# Patient Record
Sex: Female | Born: 1990 | Race: White | Marital: Married
Health system: Southern US, Community
[De-identification: ages and names within clinical notes are randomized; demographics above are authoritative.]

## PROBLEM LIST (undated history)

## (undated) DIAGNOSIS — K529 Noninfective gastroenteritis and colitis, unspecified: Secondary | ICD-10-CM

## (undated) DIAGNOSIS — R05 Cough: Secondary | ICD-10-CM

## (undated) DIAGNOSIS — R059 Cough, unspecified: Secondary | ICD-10-CM

## (undated) DIAGNOSIS — K219 Gastro-esophageal reflux disease without esophagitis: Secondary | ICD-10-CM

## (undated) DIAGNOSIS — J189 Pneumonia, unspecified organism: Secondary | ICD-10-CM

## (undated) HISTORY — DX: Gastro-esophageal reflux disease without esophagitis: K21.9

## (undated) HISTORY — DX: Pneumonia, unspecified organism: J18.9

## (undated) HISTORY — PX: OTHER SURGICAL HISTORY: SHX169

## (undated) HISTORY — DX: Cough: R05

## (undated) HISTORY — DX: Cough, unspecified: R05.9

---

## 2011-07-20 ENCOUNTER — Encounter: Payer: Self-pay | Admitting: Internal Medicine

## 2011-07-21 ENCOUNTER — Institutional Professional Consult (permissible substitution): Payer: Self-pay | Admitting: Internal Medicine

## 2011-07-31 ENCOUNTER — Encounter: Payer: Self-pay | Admitting: Internal Medicine

## 2011-07-31 ENCOUNTER — Ambulatory Visit (INDEPENDENT_AMBULATORY_CARE_PROVIDER_SITE_OTHER): Payer: Self-pay | Admitting: Internal Medicine

## 2011-07-31 ENCOUNTER — Institutional Professional Consult (permissible substitution): Payer: Self-pay | Admitting: Internal Medicine

## 2011-07-31 DIAGNOSIS — J479 Bronchiectasis, uncomplicated: Secondary | ICD-10-CM | POA: Insufficient documentation

## 2011-07-31 MED ORDER — AMOXICILLIN-POT CLAVULANATE 875-125 MG PO TABS: 1.0000 | ORAL_TABLET | Freq: Two times a day (BID) | ORAL | Status: DC

## 2011-07-31 NOTE — Patient Instructions (Addendum)
Bronchiectasis =   you have scarring of your bronchial tubes which means that they don't function perfectly normally and mucus tends to pool in certain areas of your lung which can cause pneumonia and further scarring of your lung and bronchial tubes  Whenever you develop cough congestion take mucinex or mucinex dm > these will help keep the mucus loose and flowing but if your condition worsens you need to seek help immediately preferably here or somewhere inside the [facility] system to compare xrays ( worse = darker or bloody mucus or pain on breathing in)    Please remember to go to the lab department downstairs for your tests - we will call you with the results when they are available.  Augmentin 875 one twice daily bfast and supper with a large glass of water and eat culture yogurt for lunch  Best cough medication is mucinex dm 1-2 every 12 hours as needed  Please schedule a follow up office visit in 2 weeks, sooner if needed - we'll go over your studies in detail and decide about ct scans then

## 2011-07-31 NOTE — Assessment & Plan Note (Addendum)
-   Quantitative Immunoglobulin profile 07/31/2011 >    - Alpha one AT 07/31/2011 >>    - Allergy profile 07/31/2011 >>>  Persistent basliar densities assoc with clubbing c/w bronchiectasis ? Sinusitis suggesting some form of MC dysfunction - newly acquired so doubt CF but could have some form of ciliary dyskinesia or alpha one def and immunodeficiency  Will need to return for pft's and ct sinus and chest and be referred to [facility] for sweat chloride to complete the w/u  See instructions for specific recommendations which were reviewed directly with the patient who was given a copy with highlighter outlining the key components.

## 2011-07-31 NOTE — Progress Notes (Signed)
  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 with persistent cough x 2011 referred by Dr Reola Calkins 07/31/2011 for pulmonary eval for abn cxr   07/31/2011  1st pulmonary eval cc never completely better since onset of cough while at [place] around 2011 saw doctor/ never specialist >   rx with a "bunch of things for allergy" nothing completely cleared then w/in a few weeks worse again then saw Reola Calkins in Februrary 2013 with persistent cough > green mucus assoc with nasal congestion rx inhaler and abx  with best rx from abx which turned the mucus back to clear but continues to cough intermittently day and night with thick mucus production and moderate nasal congestion s typical seasonal or allergic features. No problem swallowing. No fm hx lung dz/ CF/  No problems as child with pna.  Sleeping ok without nocturnal  or early am exacerbation  of respiratory  c/o's or need for noct saba. Also denies any obvious fluctuation of symptoms with weather or environmental changes or other aggravating or alleviating factors except as outlined above          Review of Systems  Constitutional: Negative for fever, chills and unexpected weight change.  HENT: Positive for congestion. Negative for ear pain, nosebleeds, sore throat, rhinorrhea, sneezing, trouble swallowing, dental problem, voice change, postnasal drip and sinus pressure.   Eyes: Negative for visual disturbance.  Respiratory: Positive for cough. Negative for choking and shortness of breath.   Cardiovascular: Negative for chest pain and leg swelling.  Gastrointestinal: Negative for vomiting, abdominal pain and diarrhea.  Genitourinary: Negative for difficulty urinating.  Musculoskeletal: Negative for arthralgias.  Skin: Negative for rash.  Neurological: Negative for tremors, syncope and headaches.  Hematological: Does not bruise/bleed  easily.       Objective:   Physical Exam  Pleasant reserved amb wf nad Wt 127  07/31/2011 HEENT: nl dentition, turbinates, and orophanx. Nl external ear canals without cough reflex   NECK :  without JVD/Nodes/TM/ nl carotid upstrokes bilaterally   LUNGS: no acc muscle use, insp and exp rhonchi but no true wheeze   CV:  RRR  no s3 or murmur or increase in P2, no edema   ABD:  soft and nontender with nl excursion in the supine position. No bruits or organomegaly, bowel sounds nl  MS:  warm without deformities, calf tenderness, cyanosis - MOD CLUBBING bilaterally  SKIN: warm and dry without lesions    NEURO:  alert, approp, no deficits        Assessment & Plan:

## 2011-08-07 ENCOUNTER — Telehealth: Payer: Self-pay | Admitting: Internal Medicine

## 2011-08-07 ENCOUNTER — Encounter: Payer: Self-pay | Admitting: *Deleted

## 2011-08-07 MED ORDER — LEVOFLOXACIN 750 MG PO TABS: 750.0000 mg | ORAL_TABLET | Freq: Every day | ORAL | Status: AC

## 2011-08-07 NOTE — Telephone Encounter (Signed)
ATC the number provided again, NA and no option to leave a msg, [facility]

## 2011-08-07 NOTE — Telephone Encounter (Signed)
Called # provided above - received msg stating the wireless customer is not available, pls try your call again later.  No option to leave VM.  WCB

## 2011-08-07 NOTE — Telephone Encounter (Signed)
levquin 750 one daily x 5 days

## 2011-08-07 NOTE — Telephone Encounter (Signed)
I called pt again at # provided above - I received same msg.  No option to leave VM.  WCB

## 2011-08-07 NOTE — Telephone Encounter (Signed)
Spoke with pt. She states that she was not able to start taking the augmentin MW prescribed until 4-23. She took med for 2 days and then broke out in a rash on her face- was seen by her PCP this am and was advised that she should stop med and call here. She is taking clariton for rash. Wants to know if we can prescribe something else. Her cough is only slightly improved since we saw her. Please advise, thanks! Allergies  Allergen Reactions  . Augmentin (Amoxicillin-Pot Clavulanate) Rash

## 2011-08-07 NOTE — Telephone Encounter (Signed)
Called, spoke with pt.  I informed her MW recs she take levaquin 750 mg qd x 5 days and rx sent to [facility].  Advised if she does have problems with this abx to call back or seek emergency care if needed.  She verbalized understanding of this recs.

## 2011-08-12 ENCOUNTER — Encounter: Payer: Self-pay | Admitting: Internal Medicine

## 2011-08-14 ENCOUNTER — Encounter: Payer: Self-pay | Admitting: Internal Medicine

## 2011-08-14 NOTE — Progress Notes (Signed)
 This encounter was created in error - please disregard.

## 2011-08-20 ENCOUNTER — Encounter: Payer: Self-pay | Admitting: Internal Medicine

## 2011-08-26 ENCOUNTER — Telehealth: Payer: Self-pay | Admitting: Internal Medicine

## 2011-08-26 ENCOUNTER — Encounter: Payer: Self-pay | Admitting: Internal Medicine

## 2011-08-26 NOTE — Telephone Encounter (Signed)
Sorry no I don't

## 2011-08-26 NOTE — Telephone Encounter (Signed)
I spoke with mother and she is wanting to know if MW knows a pulmonologists in [place] she can be referred to. Please advise MW thanks

## 2011-08-26 NOTE — Progress Notes (Signed)
 This encounter was created in error - please disregard.

## 2011-08-26 NOTE — Telephone Encounter (Signed)
I spoke with Beth Larsen and is aware of MW response, she voiced her understanding and had no questions

## 2011-11-02 ENCOUNTER — Encounter: Payer: Self-pay | Admitting: Internal Medicine

## 2012-02-21 ENCOUNTER — Emergency Department (HOSPITAL_COMMUNITY)

## 2012-02-21 ENCOUNTER — Encounter (HOSPITAL_COMMUNITY): Payer: Self-pay | Admitting: Emergency Medicine

## 2012-02-21 ENCOUNTER — Inpatient Hospital Stay (HOSPITAL_COMMUNITY)
Admission: EM | Admit: 2012-02-21 | Discharge: 2012-02-26 | DRG: 871 | Disposition: A | Discharge status: Home / Self Care | Attending: Internal Medicine | Admitting: Internal Medicine

## 2012-02-21 DIAGNOSIS — J9600 Acute respiratory failure, unspecified whether with hypoxia or hypercapnia: Secondary | ICD-10-CM | POA: Diagnosis present

## 2012-02-21 DIAGNOSIS — Z9049 Acquired absence of other specified parts of digestive tract: Secondary | ICD-10-CM

## 2012-02-21 DIAGNOSIS — E871 Hypo-osmolality and hyponatremia: Secondary | ICD-10-CM | POA: Diagnosis present

## 2012-02-21 DIAGNOSIS — D473 Essential (hemorrhagic) thrombocythemia: Secondary | ICD-10-CM | POA: Diagnosis present

## 2012-02-21 DIAGNOSIS — D72829 Elevated white blood cell count, unspecified: Secondary | ICD-10-CM | POA: Insufficient documentation

## 2012-02-21 DIAGNOSIS — K219 Gastro-esophageal reflux disease without esophagitis: Secondary | ICD-10-CM | POA: Diagnosis present

## 2012-02-21 DIAGNOSIS — D75839 Thrombocytosis, unspecified: Secondary | ICD-10-CM | POA: Diagnosis present

## 2012-02-21 DIAGNOSIS — A419 Sepsis, unspecified organism: Principal | ICD-10-CM | POA: Diagnosis present

## 2012-02-21 DIAGNOSIS — J479 Bronchiectasis, uncomplicated: Secondary | ICD-10-CM | POA: Diagnosis present

## 2012-02-21 DIAGNOSIS — D509 Iron deficiency anemia, unspecified: Secondary | ICD-10-CM | POA: Diagnosis present

## 2012-02-21 DIAGNOSIS — Z8719 Personal history of other diseases of the digestive system: Secondary | ICD-10-CM

## 2012-02-21 DIAGNOSIS — Z932 Ileostomy status: Secondary | ICD-10-CM

## 2012-02-21 DIAGNOSIS — R000 Tachycardia, unspecified: Secondary | ICD-10-CM | POA: Diagnosis present

## 2012-02-21 DIAGNOSIS — E872 Acidosis, unspecified: Secondary | ICD-10-CM | POA: Insufficient documentation

## 2012-02-21 DIAGNOSIS — Z79899 Other long term (current) drug therapy: Secondary | ICD-10-CM

## 2012-02-21 DIAGNOSIS — E876 Hypokalemia: Secondary | ICD-10-CM | POA: Diagnosis present

## 2012-02-21 DIAGNOSIS — J189 Pneumonia, unspecified organism: Secondary | ICD-10-CM | POA: Diagnosis present

## 2012-02-21 DIAGNOSIS — K529 Noninfective gastroenteritis and colitis, unspecified: Secondary | ICD-10-CM | POA: Insufficient documentation

## 2012-02-21 DIAGNOSIS — D649 Anemia, unspecified: Secondary | ICD-10-CM | POA: Diagnosis present

## 2012-02-21 DIAGNOSIS — E46 Unspecified protein-calorie malnutrition: Secondary | ICD-10-CM | POA: Diagnosis present

## 2012-02-21 HISTORY — DX: Noninfective gastroenteritis and colitis, unspecified: K52.9

## 2012-02-21 LAB — PROCALCITONIN: Procalcitonin: 0.66 ng/mL

## 2012-02-21 LAB — CBC WITH DIFFERENTIAL/PLATELET
Eosinophils Absolute: 0 10*3/uL (ref 0.0–0.7)
Lymphs Abs: 0.7 10*3/uL (ref 0.7–4.0)
MCH: 21.5 pg — ABNORMAL LOW (ref 26.0–34.0)
MCHC: 31.3 g/dL (ref 30.0–36.0)
MCV: 68.8 fL — ABNORMAL LOW (ref 78.0–100.0)
Monocytes Absolute: 2.2 10*3/uL — ABNORMAL HIGH (ref 0.1–1.0)
Neutrophils Relative %: 88 % — ABNORMAL HIGH (ref 43–77)
Platelets: 984 10*3/uL (ref 150–400)
RDW: 17.1 % — ABNORMAL HIGH (ref 11.5–15.5)

## 2012-02-21 LAB — BLOOD GAS, ARTERIAL
Acid-base deficit: 2.3 mmol/L — ABNORMAL HIGH (ref 0.0–2.0)
Drawn by: 331471
O2 Content: 4 L/min
pCO2 arterial: 31 mmHg — ABNORMAL LOW (ref 35.0–45.0)
pH, Arterial: 7.441 (ref 7.350–7.450)
pO2, Arterial: 69.7 mmHg — ABNORMAL LOW (ref 80.0–100.0)

## 2012-02-21 LAB — COMPREHENSIVE METABOLIC PANEL
Albumin: 3.4 g/dL — ABNORMAL LOW (ref 3.5–5.2)
Alkaline Phosphatase: 120 U/L — ABNORMAL HIGH (ref 39–117)
BUN: 11 mg/dL (ref 6–23)
Creatinine, Ser: 0.55 mg/dL (ref 0.50–1.10)
GFR calc Af Amer: >90 mL/min (ref >90)
Glucose, Bld: 128 mg/dL — ABNORMAL HIGH (ref 70–99)
Total Bilirubin: 0.9 mg/dL (ref 0.3–1.2)
Total Protein: 10.3 g/dL — ABNORMAL HIGH (ref 6.0–8.3)

## 2012-02-21 LAB — URINALYSIS, ROUTINE W REFLEX MICROSCOPIC
Glucose, UA: NEGATIVE mg/dL
Nitrite: NEGATIVE
Protein, ur: 100 mg/dL — AB

## 2012-02-21 LAB — URINE MICROSCOPIC-ADD ON

## 2012-02-21 LAB — HIV ANTIBODY (ROUTINE TESTING W REFLEX): HIV: NONREACTIVE

## 2012-02-21 LAB — MRSA PCR SCREENING: MRSA by PCR: POSITIVE — AB

## 2012-02-21 LAB — LACTIC ACID, PLASMA: Lactic Acid, Venous: 3.2 mmol/L — ABNORMAL HIGH (ref 0.5–2.2)

## 2012-02-21 MED ORDER — SODIUM CHLORIDE 0.9 % IV BOLUS (SEPSIS)
1000.0000 mL | Freq: Once | INTRAVENOUS | Status: AC
  Administered 2012-02-21: 1000 mL via INTRAVENOUS

## 2012-02-21 MED ORDER — BIOTENE DRY MOUTH MT LIQD
15.0000 mL | Freq: Two times a day (BID) | OROMUCOSAL | Status: DC
  Administered 2012-02-21 – 2012-02-26 (×10): 15 mL via OROMUCOSAL
  Filled 2012-02-21: qty 15

## 2012-02-21 MED ORDER — BACITRACIN ZINC 500 UNIT/GM EX OINT
TOPICAL_OINTMENT | Freq: Two times a day (BID) | CUTANEOUS | Status: DC
  Administered 2012-02-21 – 2012-02-22 (×2): 1 via TOPICAL
  Administered 2012-02-23: 10:00:00 via TOPICAL
  Filled 2012-02-21: qty 15

## 2012-02-21 MED ORDER — DEXTROSE 5 % IV SOLN
1.0000 g | INTRAVENOUS | Status: DC
  Administered 2012-02-21: 1 g via INTRAVENOUS
  Filled 2012-02-21: qty 10

## 2012-02-21 MED ORDER — CHLORHEXIDINE GLUCONATE CLOTH 2 % EX PADS
6.0000 | MEDICATED_PAD | Freq: Every day | CUTANEOUS | Status: AC
  Administered 2012-02-22 – 2012-02-26 (×5): 6 via TOPICAL

## 2012-02-21 MED ORDER — IMIPENEM-CILASTATIN 500 MG IV SOLR
500.0000 mg | Freq: Three times a day (TID) | INTRAVENOUS | Status: DC
  Filled 2012-02-21 (×2): qty 500

## 2012-02-21 MED ORDER — MUPIROCIN 2 % EX OINT
1.0000 "application " | TOPICAL_OINTMENT | Freq: Two times a day (BID) | CUTANEOUS | Status: AC
  Administered 2012-02-22 – 2012-02-26 (×9): 1 via NASAL
  Filled 2012-02-21: qty 22

## 2012-02-21 MED ORDER — DEXTROSE 5 % IV SOLN
500.0000 mg | INTRAVENOUS | Status: DC
  Administered 2012-02-21: 500 mg via INTRAVENOUS
  Filled 2012-02-21: qty 500

## 2012-02-21 MED ORDER — ENOXAPARIN SODIUM 40 MG/0.4ML ~~LOC~~ SOLN
40.0000 mg | SUBCUTANEOUS | Status: DC
  Administered 2012-02-22: 40 mg via SUBCUTANEOUS
  Filled 2012-02-21: qty 0.4

## 2012-02-21 MED ORDER — LORAZEPAM 2 MG/ML IJ SOLN
1.0000 mg | Freq: Once | INTRAMUSCULAR | Status: AC
  Administered 2012-02-21: 1 mg via INTRAVENOUS
  Filled 2012-02-21: qty 1

## 2012-02-21 MED ORDER — ACETAMINOPHEN 325 MG PO TABS
650.0000 mg | ORAL_TABLET | Freq: Four times a day (QID) | ORAL | Status: DC | PRN
  Administered 2012-02-21: 650 mg via ORAL
  Filled 2012-02-21: qty 2

## 2012-02-21 MED ORDER — SODIUM CHLORIDE 0.9 % IV BOLUS (SEPSIS)
2000.0000 mL | Freq: Once | INTRAVENOUS | Status: AC
  Administered 2012-02-21: 1000 mL via INTRAVENOUS

## 2012-02-21 MED ORDER — SODIUM CHLORIDE 0.9 % IV SOLN
INTRAVENOUS | Status: DC
  Administered 2012-02-21: 125 mL/h via INTRAVENOUS
  Administered 2012-02-21: 13:00:00 via INTRAVENOUS

## 2012-02-21 MED ORDER — DEXTROSE 5 % IV SOLN
1.0000 g | Freq: Three times a day (TID) | INTRAVENOUS | Status: DC
  Administered 2012-02-21 – 2012-02-22 (×3): 1 g via INTRAVENOUS
  Filled 2012-02-21 (×3): qty 1

## 2012-02-21 MED ORDER — LEVOFLOXACIN IN D5W 750 MG/150ML IV SOLN
750.0000 mg | INTRAVENOUS | Status: DC
  Administered 2012-02-21 – 2012-02-22 (×2): 750 mg via INTRAVENOUS
  Filled 2012-02-21 (×2): qty 150

## 2012-02-21 MED ORDER — VANCOMYCIN HCL 1000 MG IV SOLR
750.0000 mg | Freq: Three times a day (TID) | INTRAVENOUS | Status: DC
  Administered 2012-02-21 – 2012-02-23 (×6): 750 mg via INTRAVENOUS
  Filled 2012-02-21 (×7): qty 750

## 2012-02-21 NOTE — Consult Note (Signed)
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 cough x 2011 referred by Dr Reola Calkins 07/31/2011 for pulmonary eval for abn cxr dx with bronchiectasis/clubbing presented to [facility]  11/10 with sev days increasing cough with green sputum and sob with bilateral infiltrates on cxr c/w pna > pccm svc requested pm 11/10  HPI  07/31/2011 1st pulmonary eval cc never completely better since onset of cough while at [employer] [place] around 2011 saw doctor/ never specialist > rx with a "bunch of things for allergy" nothing completely cleared then w/in a few weeks worse again then saw Reola Calkins in Februrary 2013 with persistent cough > green mucus assoc with nasal congestion rx inhaler and abx with best rx from abx which turned the mucus back to clear but continues to cough intermittently day and night with thick mucus production and moderate nasal congestion s typical seasonal or allergic features. No problem swallowing.  No fm hx lung dz/ CF/ No problems as child with pna. rec w/u for alpha one > neg and lost to f/u  At baseline Sleeping ok without nocturnal  or early am exacerbation  of respiratory  c/o's or need for noct saba. 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 Cough   . GERD (gastroesophageal reflux disease)   . PNA (pneumonia)   . Colitis, acute    Past Surgical History  Procedure Date  . Illeostomy    Prior to Admission medications   Medication Sig Start Date End Date Taking? Authorizing Provider  cetirizine (ZYRTEC) 10 MG tablet Take 10 mg by mouth  daily.   Yes Historical Provider, MD  FLUoxetine (PROZAC) 10 MG capsule Take 30 mg by mouth daily.    Historical Provider, MD   Allergies Allergies  Allergen Reactions  . Augmentin (Amoxicillin-Pot Clavulanate) Rash    Family History Family History  Problem Relation Age of Onset  . Other Mother    Social History  reports that she has never smoked. She has never used smokeless tobacco. She reports that she does not drink alcohol or use illicit drugs.  Review Of Systems:  ROS  The following are not active complaints unless bolded sore throat, dysphagia, dental problems, itching, sneezing,  nasal congestion or excess/ purulent secretions, ear ache,   fever, chills, sweats, unintended wt loss, pleuritic or exertional cp, hemoptysis,  orthopnea pnd or leg swelling, presyncope, palpitations, heartburn, abdominal pain, anorexia, nausea, vomiting, diarrhea  or change in bowel or urinary habits, change in stools or urine, dysuria,hematuria,  rash, arthralgias, visual complaints, headache, numbness weakness or ataxia or problems with walking or coordination,  change in mood/affect or memory.      Vital Signs: Temp:  [97.7 F (36.5 C)-98.7 F (37.1 C)] 97.7 F (36.5 C) (11/10 1242) Pulse Rate:  [40-171] 153  (11/10 1508) Resp:  [25-48] 37  (11/10 1508) BP: (113-115)/(67-90) 115/67 mmHg (11/10 1508) SpO2:  [70 %-100 %] 99 % (11/10 1508)  Physical Examination: General:  Frail chronically and acutely ll appearing Musculoskeletal:  POS prominent clubbing Skin:  Pale RRR no s3 Chest with bilateral coarse rhonchi Abd soft, ileostomy bag in place Neuro anxious but alert  Principal Problem:  *Sepsis Active Problems:  Bronchiectasis  Recurrent pneumonia  GERD (gastroesophageal reflux disease)  Colitis, acute  Tachycardia   ASSESSMENT AND PLAN  PULMONARY  Lab 02/21/12 1442  PHART 7.441  PCO2ART 31.0*  PO2ART 69.7*  HCO3 20.8  O2SAT 92.3       CXR 11/10:  Patchy pulmonary  infiltrates bilaterally, lower lobe predominant,  most consistent with bronchopneumonia     A:  Pneumonia in pt with bronchiectasis / inflammatory bowel dz P:  Primaxin / Zmax approp broad rx  CARDIOVASCULAR  Lab 02/21/12 1315  TROPONINI --  LATICACIDVEN 3.2*  PROBNP --      A: tachycardia related to fever/ anxiety/ dehydration Rec Vol expand  RENAL  Lab 02/21/12 1315  NA 124*  K 4.2  CL 85*  CO2 21  BUN 11  CREATININE 0.55  CALCIUM 10.3  MG --  PHOS --   Intake/Output    None       A:  Hyponatremia ? Due to bronchiectasis/ pna or just inadequate solid intake relative to water P:   NS fluid exp  GASTROINTESTINAL  Lab 02/21/12 1315  AST 27  ALT 16  ALKPHOS 120*  BILITOT 0.9  PROT 10.3*  ALBUMIN 3.4*    A: s/p ileostomy for "colitis" ? Related to bronchiectasis? Not on steroids  HEMATOLOGIC  Lab 02/21/12 1315  HGB 10.3*  HCT 32.9*  PLT 984*  INR --  APTT --    A:   Mild anemia/thrombocytosis  INFECTIOUS  Lab 02/21/12 1315  WBC 24.0*  PROCALCITON --   Cultures:    Antibiotics: Roc 11/10 x one Zmax 11/10 >>> Pmaxis 11/10  A: Severe pneumonia P:   abx as above     BEST PRACTICE / DISPOSITION Level of Care:  Step down approp Primary Service:  [facility] Consultants:  PCCM Code Status:  Full Diet:  regular DVT Px:  Sq hep ok GI Px:  Not needed Skin Integrity:  ok Social / Family:  No fm in er  The patient is critically ill with multiple organ systems failure and requires high complexity decision making for assessment and support, frequent evaluation and titration of therapies, application of advanced monitoring technologies and extensive interpretation of multiple databases. Critical Care Time devoted to patient care services described in this note is 45 minutes.   Sandrea Hughs, M.D. Pulmonary and Critical Care Medicine [facility] Pager: [phone]  02/21/2012, 3:49 PM

## 2012-02-21 NOTE — H&P (Addendum)
Patient's PCP: Blenda Mounts, MD  Chief Complaint: Shortness of breath.  History of Present Illness: Beth Larsen is a 21 y.o. 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  Patient notes that she has had persistent cough for last few years which improved with antibiotics but after antibiotics were discontinued she would again then develop cough at baseline.  Her most recent symptoms started 2 days ago with cough productive for green sputum.  She also has had sinus and nasal congestion.  She last completed course of antibiotics in August of 2013.  Has felt feverish at home.  Denies any chest pain.  Denies any abdominal pain or diarrhea.  Denies any headaches or vision changes.  In the emergency department patient was found to be tachycardic with HR of 160s, she is also tachypneic with respiratory rate in the 30s to 40s with hypoxia, chest x-ray shows patchy pulmonary infiltrates bilaterally.  The hospitalist service was asked to admit the patient for management of pneumonia.  Past Medical History  Diagnosis Date  . Cough   . GERD (gastroesophageal reflux disease)   . PNA (pneumonia)   . Colitis, acute    Past Surgical History  Procedure Date  . Illeostomy    Family History  Problem Relation Age of Onset  . Other Mother    History   Social History  . Marital Status: Single    Spouse Name: N/A    Number of Children: 0  . Years of Education: N/A   Occupational History  . Full Time Student    Social History Main Topics  . Smoking status: Never Smoker   . Smokeless tobacco: Never Used  . Alcohol Use: No  . Drug Use: No  . Sexually Active: Not on file   Other Topics Concern  . Not on file   Social History Narrative   Patient is currently a Consulting civil engineer at [employer].   Family history: Mother has low blood sugars otherwise healthy, patient did not  know why her mother had low blood sugars.  Allergies: Augmentin  Home Meds: Prior to Admission medications   Medication Sig Start Date End Date Taking? Authorizing Provider  cetirizine (ZYRTEC) 10 MG tablet Take 10 mg by mouth daily.   Yes Historical Provider, MD  FLUoxetine (PROZAC) 10 MG capsule Take 30 mg by mouth daily.    Historical Provider, MD    Review of Systems: All systems reviewed with the patient and positive as per history of present illness, otherwise all other systems are negative.  Physical Exam: Blood pressure 115/67, pulse 153, temperature 97.7 F (36.5 C), temperature source Oral, resp. rate 37, last menstrual period 02/11/2012, SpO2 99.00%. General: Awake, Oriented x3, in some respiratory distress with tachypnea. HEENT: EOMI, Moist mucous membranes Neck: Supple CV: S1 and S2, tachycardic. Lungs: Crackles at bases bilaterally. Abdomen: Soft, Nontender, Nondistended, +bowel sounds. Ext: Good pulses. Trace edema. No clubbing or cyanosis noted. Neuro: Cranial Nerves II-XII grossly intact. Has 5/5 motor strength in upper and lower extremities.  Lab results:  [facility] 02/21/12 1315  NA 124*  K 4.2  CL 85*  CO2 21  GLUCOSE 128*  BUN 11  CREATININE 0.55  CALCIUM 10.3  MG --  PHOS --    Basename 02/21/12 1315  AST 27  ALT 16  ALKPHOS 120*  BILITOT 0.9  PROT 10.3*  ALBUMIN 3.4*   No results  found for this basename: LIPASE:2,AMYLASE:2 in the last 72 hours  Basename 02/21/12 1315  WBC 24.0*  NEUTROABS 21.1*  HGB 10.3*  HCT 32.9*  MCV 68.8*  PLT 984*   No results found for this basename: CKTOTAL:3,CKMB:3,CKMBINDEX:3,TROPONINI:3 in the last 72 hours No components found with this basename: POCBNP:3 No results found for this basename: DDIMER in the last 72 hours No results found for this basename: HGBA1C:2 in the last 72 hours No results found for this basename: CHOL:2,HDL:2,LDLCALC:2,TRIG:2,CHOLHDL:2,LDLDIRECT:2 in the last 72 hours No results  found for this basename: TSH,T4TOTAL,FREET3,T3FREE,THYROIDAB in the last 72 hours No results found for this basename: VITAMINB12:2,FOLATE:2,FERRITIN:2,TIBC:2,IRON:2,RETICCTPCT:2 in the last 72 hours Imaging results:  Dg Chest 2 View  02/21/2012  *RADIOLOGY REPORT*  Clinical Data: Productive cough.  History of pneumonia.  CHEST - 2 VIEW  Comparison: None.  Findings: Heart size is normal.  There are extensive patchy infiltrates throughout both lungs, lower lobe predominant.  This most likely represents widespread bronchopneumonia.  No effusions. No significant bony finding.  The patchy/nodular pattern does raise the possibility of other entities such as sarcoid, masses and Wegener's granulomatosis. Those diagnoses are certainly less likely than ordinary bronchopneumonia.  IMPRESSION: Patchy pulmonary infiltrates bilaterally, lower lobe predominant, most consistent with bronchopneumonia.  See above.   Original Report Authenticated By: Paulina Fusi, M.D.    Other results: EKG: Sinus tachycardia, heart rate of 169.  Assessment & Plan by Problem: Sepsis Due to pneumonia.  Lactic acid elevated at 3.2, recheck lactic acid tomorrow.  Procalcitonin 0.66.  Continue aggressive fluid resuscitation.  Discussed with PCCM, who will follow the patient as consultants, appreciate their input.  Acute Respiratory Failure/Recurrent Pneumonia Given patient's recent antibiotic use in August, and history of pneumonia.  Will treat aggressively with IV vancomycin, cefepime and levofloxacin as HCAP.  Cefepime and levofloxacin should have adequate coverage for Pseudomonas she reports having productive green sputum.  Change antibiotics pending on patient's clinical course and cultures. Given patient's recurrent pneumonia, suspect patient will likely need outpatient followup with pulmonary for further evaluation after discharge.  Hyponatremia Likely due to severe sepsis and dehydration.  Continue aggressive fluid resuscitation  reassess sodium tomorrow, if sodium is still low will consider further workup at that time.  Dehydration Likely due to poor by mouth intake. Continue IV fluids.  Tachycardia/tachypnea Likely due to sepsis and dehydration.  Continue fluid resuscitation.  Heart rate mildly improved with IV fluids.  Underweight/protein calorie malnutrition/BMI 17.1 Dietitian consulted.  May need supplemental nutrition depending on patient's oral intake.  History of colitis status post ileostomy Likely will need to follow up with her primary care physician for a referral to gastroenterology for followup as outpatient.  Thrombocytosis Etiology unclear.  No prior labs for comparison.  Continue to monitor.  May be related to sepsis.  Leukocytosis Leukocytosis likely due to sepsis. Continue to monitor.  GERD Not complaining of any symptoms at this time.  Continue to monitor.  Prophylaxis Lovenox.  Code status Full code.  Disposition/family communication Admit as inpatient to step down.  Patient updated at bedside. Patient's step-father Mr. Leonette Most, [phone] called and updated.  Critical care time spent on admission, talking to the patient, and coordinating care was: 60 mins.  Johnthan Axtman A, MD 02/21/2012, 3:18 PM

## 2012-02-21 NOTE — ED Provider Notes (Signed)
History     CSN: [id number]  Arrival date & time 02/21/12  1217   First MD Initiated Contact with Patient 02/21/12 1229      Chief Complaint  Patient presents with  . Cough    (Consider location/radiation/quality/duration/timing/severity/associated sxs/prior treatment) Patient is a 21 y.o. female presenting with cough. The history is provided by the patient.  Cough   Patient complains of worsening cough and shortness of breath x2 days. History of pneumonia in the past and this feels similar. Denies any vomiting or diarrhea. No reported fever at home. Cough has been productive of green sputum. Notes increased anxiety. Patient denies any recent illnesses. No leg pain or swelling or recent travel history. Denies any severe chest pain. Past Medical History  Diagnosis Date  . Cough   . GERD (gastroesophageal reflux disease)   . PNA (pneumonia)   . Colitis, acute     Past Surgical History  Procedure Date  . Illeostomy     History reviewed. No pertinent family history.  History  Substance Use Topics  . Smoking status: Never Smoker   . Smokeless tobacco: Never Used  . Alcohol Use: No    OB History    Grav Para Term Preterm Abortions TAB SAB Ect Mult Living                  Review of Systems  Respiratory: Positive for cough.   All other systems reviewed and are negative.    Allergies  Augmentin  Home Medications   Current Outpatient Rx  Name  Route  Sig  Dispense  Refill  . FLUOXETINE HCL 10 MG PO CAPS   Oral   Take 30 mg by mouth daily.           BP 113/90  Pulse 171  Temp 97.7 F (36.5 C) (Oral)  Resp 26  SpO2 78%  LMP 02/11/2012  Physical Exam  Nursing note and vitals reviewed. Constitutional: She is oriented to person, place, and time. She appears well-developed and well-nourished.  Non-toxic appearance. No distress.  HENT:  Head: Normocephalic and atraumatic.  Eyes: Conjunctivae normal, EOM and lids are normal. Pupils are equal, round,  and reactive to light.  Neck: Normal range of motion. Neck supple. No tracheal deviation present. No mass present.  Cardiovascular: Regular rhythm and normal heart sounds.  Tachycardia present.  Exam reveals no gallop.   No murmur heard. Pulmonary/Chest: Effort normal. No stridor. No respiratory distress. She has decreased breath sounds. She has wheezes. She has no rhonchi. She has no rales.  Abdominal: Soft. Normal appearance and bowel sounds are normal. She exhibits no distension. There is no tenderness. There is no rebound and no CVA tenderness.  Musculoskeletal: Normal range of motion. She exhibits no edema and no tenderness.  Neurological: She is alert and oriented to person, place, and time. She has normal strength. No cranial nerve deficit or sensory deficit. GCS eye subscore is 4. GCS verbal subscore is 5. GCS motor subscore is 6.  Skin: Skin is warm and dry. No abrasion and no rash noted.  Psychiatric: Her speech is normal and behavior is normal. Her mood appears anxious.    ED Course  Procedures (including critical care time)   Labs Reviewed  CBC WITH DIFFERENTIAL  COMPREHENSIVE METABOLIC PANEL  URINALYSIS, ROUTINE W REFLEX MICROSCOPIC  URINE CULTURE  LACTIC ACID, PLASMA   No results found.   No diagnosis found.    MDM   Date: 02/21/2012  Rate: 160  Rhythm: sinus tachycardia  QRS Axis: normal  Intervals: normal  ST/T Wave abnormalities: normal  Conduction Disutrbances:none  Narrative Interpretation:   Old EKG Reviewed: none available  Patient started on antibiotics for her community acquired pneumonia. Given IV fluids for her tachycardia. We'll continue to her hydrate and patient to be admitted to the hospital to step down  CRITICAL CARE Performed by: Toy Baker   Total critical care time: 60  Critical care time was exclusive of separately billable procedures and treating other patients.  Critical care was necessary to treat or prevent imminent or  life-threatening deterioration.  Critical care was time spent personally by me on the following activities: development of treatment plan with patient and/or surrogate as well as nursing, discussions with consultants, evaluation of patient's response to treatment, examination of patient, obtaining history from patient or surrogate, ordering and performing treatments and interventions, ordering and review of laboratory studies, ordering and review of radiographic studies, pulse oximetry and re-evaluation of patient's condition.           Toy Baker, MD 02/21/12 [phone]

## 2012-02-21 NOTE — Progress Notes (Signed)
ANTIBIOTIC CONSULT NOTE - INITIAL  Pharmacy Consult for Cefepime, Levaquin, Vanco (renal abx adj) Indication: pneumonia  Allergies  Allergen Reactions  . Augmentin (Amoxicillin-Pot Clavulanate) Rash    Patient Measurements: Height: 5\' 8"  (172.7 cm) (Simultaneous filing. User may not have seen previous data.) Weight: 112 lb 7 oz (51 kg) (Simultaneous filing. User may not have seen previous data.) IBW/kg (Calculated) : 63.9   Vital Signs: Temp: 99.1 F (37.3 C) (11/10 1648) Temp src: Oral (11/10 1648) BP: 126/80 mmHg (11/10 1648) Pulse Rate: 160  (11/10 1648) Intake/Output from previous day:   Intake/Output from this shift:    Labs:  Basename 02/21/12 1315  WBC 24.0*  HGB 10.3*  PLT 984*  LABCREA --  CREATININE 0.55   Estimated Creatinine Clearance: 90.3 ml/min (by C-G formula based on Cr of 0.55). No results found for this basename: VANCOTROUGH:2,VANCOPEAK:2,VANCORANDOM:2,GENTTROUGH:2,GENTPEAK:2,GENTRANDOM:2,TOBRATROUGH:2,TOBRAPEAK:2,TOBRARND:2,AMIKACINPEAK:2,AMIKACINTROU:2,AMIKACIN:2, in the last 72 hours   Microbiology: No results found for this or any previous visit (from the past 720 hour(s)).  Medical History: Past Medical History  Diagnosis Date  . Cough   . GERD (gastroesophageal reflux disease)   . PNA (pneumonia)   . Colitis, acute     Medications:  Anti-infectives     Start     Dose/Rate Route Frequency Ordered Stop   02/21/12 1800   ceFEPIme (MAXIPIME) 1 g in dextrose 5 % 50 mL IVPB        1 g 100 mL/hr over 30 Minutes Intravenous Every 8 hours 02/21/12 1641 02/29/12 1759   02/21/12 1800   levofloxacin (LEVAQUIN) IVPB 750 mg        750 mg 100 mL/hr over 90 Minutes Intravenous Every 24 hours 02/21/12 1641 02/24/12 1759   02/21/12 1600   imipenem-cilastatin (PRIMAXIN) 500 mg in sodium chloride 0.9 % 100 mL IVPB  Status:  Discontinued        500 mg 200 mL/hr over 30 Minutes Intravenous 3 times per day 02/21/12 1548 02/21/12 1655   02/21/12 1430    cefTRIAXone (ROCEPHIN) 1 g in dextrose 5 % 50 mL IVPB  Status:  Discontinued        1 g 100 mL/hr over 30 Minutes Intravenous Every 24 hours 02/21/12 1426 02/21/12 1548   02/21/12 1430   azithromycin (ZITHROMAX) 500 mg in dextrose 5 % 250 mL IVPB  Status:  Discontinued        500 mg 250 mL/hr over 60 Minutes Intravenous Every 24 hours 02/21/12 1426 02/21/12 1641         Assessment: 21 yo F admitted 02/21/12 with PNA. Switching antibiotics to cefepime, levaquin, and Vanco x8 days. CrCl(n) > 136ml/hr.  Goal of Therapy:  Vancomycin trough level 15-20 mcg/ml  Plan:  1) Continue cefepime 1g IV q8h and Levaquin 750mg  IV q24h as ordered by MD. 2) Vanco 750mg  IV q8h x8 days 3) D/C Primaxin (v.o. Dr. Betti Cruz)  Darrol Angel, PharmD Pager: [phone] 02/21/2012,5:04 PM

## 2012-02-21 NOTE — ED Notes (Signed)
Patient states that she is having cough and coughing up green sputum. Patient is dyspneic and breathing very fast. Reports that she is nervous /anxious

## 2012-02-21 NOTE — Progress Notes (Addendum)
ANTIBIOTIC CONSULT NOTE - INITIAL  Pharmacy Consult for Primaxin Indication: Pneumonia  Allergies  Allergen Reactions  . Augmentin (Amoxicillin-Pot Clavulanate) Rash    Patient Measurements:   Adjusted Body Weight:  Vital Signs: Temp: 97.7 F (36.5 C) (11/10 1242) Temp src: Oral (11/10 1242) BP: 115/67 mmHg (11/10 1508) Pulse Rate: 153  (11/10 1508) Intake/Output from previous day:   Intake/Output from this shift:    Labs:  Basename 02/21/12 1315  WBC 24.0*  HGB 10.3*  PLT 984*  LABCREA --  CREATININE 0.55   The CrCl is unknown because both a height and weight (above a minimum accepted value) are required for this calculation. No results found for this basename: VANCOTROUGH:2,VANCOPEAK:2,VANCORANDOM:2,GENTTROUGH:2,GENTPEAK:2,GENTRANDOM:2,TOBRATROUGH:2,TOBRAPEAK:2,TOBRARND:2,AMIKACINPEAK:2,AMIKACINTROU:2,AMIKACIN:2, in the last 72 hours   Microbiology: No results found for this or any previous visit (from the past 720 hour(s)).  Medical History: Past Medical History  Diagnosis Date  . Cough   . GERD (gastroesophageal reflux disease)   . PNA (pneumonia)   . Colitis, acute     Medications:   (Not in a hospital admission) Anti-infectives     Start     Dose/Rate Route Frequency Ordered Stop   02/21/12 1600   imipenem-cilastatin (PRIMAXIN) 500 mg in sodium chloride 0.9 % 100 mL IVPB        500 mg 200 mL/hr over 30 Minutes Intravenous 3 times per day 02/21/12 1548     02/21/12 1430   cefTRIAXone (ROCEPHIN) 1 g in dextrose 5 % 50 mL IVPB  Status:  Discontinued        1 g 100 mL/hr over 30 Minutes Intravenous Every 24 hours 02/21/12 1426 02/21/12 1548   02/21/12 1430   azithromycin (ZITHROMAX) 500 mg in dextrose 5 % 250 mL IVPB        500 mg 250 mL/hr over 60 Minutes Intravenous Every 24 hours 02/21/12 1426           Assessment:  21yo F admitted with cough and purulent sputum.  Starting Primaxin and Azithromycin for bronchopneumonia.  SCr wnl, CrCl  >100.  Most recent weight(April 2013) = 58kg.  Goal of Therapy:  Eradication of infection. Appropriate dose for renal fxn and size.  Plan:   Agree with Primaxin 500mg  IV q8h as ordered.  F/u daily, verify weight.  Charolotte Eke, PharmD, pager [phone]. 02/21/2012,4:04 PM.

## 2012-02-22 ENCOUNTER — Inpatient Hospital Stay (HOSPITAL_COMMUNITY)

## 2012-02-22 ENCOUNTER — Encounter (HOSPITAL_COMMUNITY): Payer: Self-pay

## 2012-02-22 DIAGNOSIS — D649 Anemia, unspecified: Secondary | ICD-10-CM | POA: Diagnosis present

## 2012-02-22 DIAGNOSIS — J9600 Acute respiratory failure, unspecified whether with hypoxia or hypercapnia: Secondary | ICD-10-CM

## 2012-02-22 LAB — IRON AND TIBC
Iron: <10 ug/dL — ABNORMAL LOW (ref 42–135)
UIBC: 241 ug/dL (ref 125–400)

## 2012-02-22 LAB — CBC
Hemoglobin: 7.5 g/dL — ABNORMAL LOW (ref 12.0–15.0)
MCHC: 30.6 g/dL (ref 30.0–36.0)
MCHC: 31.4 g/dL (ref 30.0–36.0)
Platelets: 510 10*3/uL — ABNORMAL HIGH (ref 150–400)
RBC: 3.4 MIL/uL — ABNORMAL LOW (ref 3.87–5.11)
RDW: 17.2 % — ABNORMAL HIGH (ref 11.5–15.5)
WBC: 14.3 10*3/uL — ABNORMAL HIGH (ref 4.0–10.5)
WBC: 14.6 10*3/uL — ABNORMAL HIGH (ref 4.0–10.5)

## 2012-02-22 LAB — COMPREHENSIVE METABOLIC PANEL
ALT: 16 U/L (ref 0–35)
AST: 32 U/L (ref 0–37)
Albumin: 2.2 g/dL — ABNORMAL LOW (ref 3.5–5.2)
Calcium: 8.2 mg/dL — ABNORMAL LOW (ref 8.4–10.5)
GFR calc Af Amer: >90 mL/min (ref >90)
Potassium: 3.8 mEq/L (ref 3.5–5.1)
Sodium: 131 mEq/L — ABNORMAL LOW (ref 135–145)
Total Protein: 6.7 g/dL (ref 6.0–8.3)

## 2012-02-22 LAB — CORTISOL: Cortisol, Plasma: 23.2 ug/dL

## 2012-02-22 LAB — LACTIC ACID, PLASMA: Lactic Acid, Venous: 1 mmol/L (ref 0.5–2.2)

## 2012-02-22 LAB — LEGIONELLA ANTIGEN, URINE: Legionella Antigen, Urine: NEGATIVE

## 2012-02-22 LAB — PATHOLOGIST SMEAR REVIEW

## 2012-02-22 LAB — PROCALCITONIN: Procalcitonin: 6.48 ng/mL

## 2012-02-22 MED ORDER — IOHEXOL 300 MG/ML  SOLN
100.0000 mL | Freq: Once | INTRAMUSCULAR | Status: AC | PRN
  Administered 2012-02-22: 60 mL via INTRAVENOUS

## 2012-02-22 MED ORDER — SODIUM CHLORIDE 0.9 % IV BOLUS (SEPSIS)
500.0000 mL | Freq: Once | INTRAVENOUS | Status: AC
Start: 2012-02-22 — End: 2012-02-22
  Administered 2012-02-22: 500 mL via INTRAVENOUS

## 2012-02-22 NOTE — Progress Notes (Signed)
INITIAL ADULT NUTRITION ASSESSMENT Date: 02/22/2012   Time: 10:37 AM Reason for Assessment: Consult-underweight/malnutrition  ASSESSMENT: Female 21 y.o.  Dx: Jiles Harold  Hx:  Past Medical History  Diagnosis Date  . Cough   . GERD (gastroesophageal reflux disease)   . PNA (pneumonia)   . Colitis, acute    Past Surgical History  Procedure Date  . Illeostomy    Related Meds:     . antiseptic oral rinse  15 mL Mouth Rinse BID  . bacitracin   Topical BID  . ceFEPime (MAXIPIME) IV  1 g Intravenous Q8H  . Chlorhexidine Gluconate Cloth  6 each Topical Q0600  . levofloxacin (LEVAQUIN) IV  750 mg Intravenous Q24H  . [COMPLETED] LORazepam  1 mg Intravenous Once  . mupirocin ointment  1 application Nasal BID  . [COMPLETED] sodium chloride  1,000 mL Intravenous Once  . [COMPLETED] sodium chloride  1,000 mL Intravenous Once  . [COMPLETED] sodium chloride  2,000 mL Intravenous Once  . sodium chloride  500 mL Intravenous Once  . vancomycin  750 mg Intravenous Q8H  . [DISCONTINUED] azithromycin  500 mg Intravenous Q24H  . [DISCONTINUED] cefTRIAXone (ROCEPHIN)  IV  1 g Intravenous Q24H  . [DISCONTINUED] enoxaparin  40 mg Subcutaneous Q24H  . [DISCONTINUED] imipenem-cilastatin  500 mg Intravenous Q8H     Ht: 5\' 8"  (172.7 cm) (Simultaneous filing. User may not have seen previous data.)  Wt: 112 lb 7 oz (51 kg) (Simultaneous filing. User may not have seen previous data.)  Ideal Wt: 63.9 kg  % Ideal Wt: 80  Usual Wt: 115-120# per pt Wt Readings from Last 10 Encounters:  02/21/12 112 lb 7 oz (51 kg)  07/31/11 127 lb 6.4 oz (57.788 kg) (49.11%*)   * Growth percentiles are based on CDC 2-20 Years data.   % Usual Wt: 88 of weight in April.  97.2% of weight last week  Body mass index is 17.10 kg/(m^2).-underweight  Labs:  CMP     Component Value Date/Time   NA 131* 02/22/2012 0348   K 3.8 02/22/2012 0348   CL 99 02/22/2012 0348   CO2 26 02/22/2012 0348   GLUCOSE 101*  02/22/2012 0348   BUN 4* 02/22/2012 0348   CREATININE 0.59 02/22/2012 0348   CALCIUM 8.2* 02/22/2012 0348   PROT 6.7 02/22/2012 0348   ALBUMIN 2.2* 02/22/2012 0348   AST 32 02/22/2012 0348   ALT 16 02/22/2012 0348   ALKPHOS 92 02/22/2012 0348   BILITOT 0.7 02/22/2012 0348   GFRNONAA >90 02/22/2012 0348   GFRAA >90 02/22/2012 0348    I/O last 3 completed shifts: In: 2400 [P.O.:100; I.V.:1750; IV Piggyback:550] Out: 1525 [Urine:1125; Stool:400] Total I/O In: 125 [I.V.:125] Out: -    Diet Order: General  Supplements/Tube Feeding:  none  IVF:    sodium chloride Last Rate: 125 mL/hr (02/21/12 1713)    Estimated Nutritional Needs:   Kcal: 1800-2100 Protein: 65-75 gm Fluid: 1.8-2.1L  Food/Nutrition Related Hx: Pt reported eating well until the past 2 days.  Ate 3 meals and snacks.  Archivist.  Pt 80% IBW.  Obviously thin.  Weight loss of 2.5% in <1 week.  Pt meets criteria for mild-moderate malnutrition related to acute illness AEB weight loss, decreased body fat and muscle mass.  NUTRITION DIAGNOSIS: -Predicted suboptimal energy intake (NI-1.6).  Status: Ongoing  RELATED TO: illness  AS EVIDENCE BY: increased work to talk and breath  MONITORING/EVALUATION(Goals): Intake of >75% meals and snacks Monitor intake, labs, weight,  EDUCATION NEEDS: -Education needs addressed-Educated pt on increased needs.  Pt states that she is eating well and will work to increase intake.  INTERVENTION: Continue regular diet, provide preferences.   Add tid snacks  Dietitian [phone]  DOCUMENTATION CODES Per approved criteria  -Non-severe (moderate) malnutrition in the context of acute illness or injury -Underweight    Nikya Busler, Anastasia Fiedler 02/22/2012, 10:37 AM

## 2012-02-22 NOTE — Progress Notes (Signed)
[facility] PROGRESS NOTE  Beth Larsen ZOX:[id number] DOB: [birth year] DOA: 02/21/2012 PCP: Blenda Mounts, MD  Brief narrative: 21 y.o. 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   Assessment/Plan:  Sepsis  secondary to  to pneumonia. Lactic acid elevated at 3.2, now improved. . Procalcitonin 0.66.  -Continue aggressive IV fluids. Patient informs feeling better overall but still quite tachy to 120s on the monitor  with RR in 30s. sats maintained on 2L via [place]. .  -continue IV vanco and levaquin for now ( day 2). -still having purulent expectoration. Follow sputum gm stain and cx. Pending final blood cx.  -continue monitoring in stepdown. -afebrile today. Wbc improving.    Acute Respiratory Failure/Recurrent Pneumonia  -no clear cause of recurrent symptoms. Patient seen by pulmonary as outpatient in the past. Alpha 1 antitrypsin negative. No family hx of cystic fibrosis. needs follow up with  Pulmonary as outpatient.   Hyponatremia  Likely due to sepsis,  Dehydration ad PNA. Continue IV fluids.  -Level improved in am labs  Dehydration  . Continue IV fluids. Likely in the setting of poor po intake   Underweight/protein calorie malnutrition/BMI 17.1  nutrition consult.  History of colitis status post ileostomy  Had surgery at the age of 71 at [facility]. Needs to follow up as outpt  Thrombocytosis  Likely related to sepsis. Now improving  Anemia Drop in H&H noted today to 7.5. Check stool for occult blood and anemia panel. informs occasional heavy menses. Last hb in the system over 6 mths back of 9.5. Ordered 2 U prbc. Will monitor  DVT prophylaxis: d/c lovenox. Place on SCD given anemia  Code Status: FULL Family Communication:none at bedisde Disposition Plan: home once  stable   Consultants:  PCCM  Procedures:  none  Antibiotics:  IV vanco and levaquin  : 11/10>>  HPI/Subjective: Feels better overall.s till has purulent cough. Noted to be tachycardic on the monitor   Objective: Filed Vitals:   02/22/12 0900 02/22/12 1000 02/22/12 1100 02/22/12 1200  BP: 105/63 99/55 109/60 117/80  Pulse: 124 123 127 124  Temp:      TempSrc:      Resp: 30 31 36 27  Height:      Weight:      SpO2: 98% 99% 99% 100%    Intake/Output Summary (Last 24 hours) at 02/22/12 1432 Last data filed at 02/22/12 1300  Gross per 24 hour  Intake 3117.92 ml  Output   2825 ml  Net 292.92 ml   Filed Weights   02/21/12 1648  Weight: 51 kg (112 lb 7 oz)    Exam:   General: young thin built female lying in bed with increased rate of breathing  HEENT: pallor+ , dry oral mucosa  Cardiovascular: S1&S2 tachycardic, no murmurs  Respiratory: Tachypnic, scatteered rhonchi b/l, crackles over left lung  Abdomen: soft, NT, ND, BS+  EXT: warm, no edema  CNS: AAOX 3, non focal  Data Reviewed: Basic Metabolic Panel:  Lab 02/22/12 8119 02/21/12 1315  NA 131* 124*  K 3.8 4.2  CL 99 85*  CO2 26 21  GLUCOSE 101* 128*  BUN 4* 11  CREATININE 0.59 0.55  CALCIUM 8.2* 10.3  MG -- --  PHOS -- --   Liver Function Tests:  Lab 02/22/12 0348 02/21/12 1315  AST 32 27  ALT 16 16  ALKPHOS 92 120*  BILITOT 0.7 0.9  PROT 6.7 10.3*  ALBUMIN 2.2* 3.4*   No results found for this basename: LIPASE:5,AMYLASE:5 in the last 168 hours No results found for this basename: AMMONIA:5 in the last 168 hours CBC:  Lab 02/22/12 0430 02/22/12 0348 02/21/12 1315  WBC 14.6* 14.3* 24.0*  NEUTROABS -- -- 21.1*  HGB 7.5* 7.7* 10.3*  HCT 23.9* 25.2* 32.9*  MCV 70.3* 70.2* 68.8*  PLT 483* 510* 984*   Cardiac Enzymes: No results found for this basename: CKTOTAL:5,CKMB:5,CKMBINDEX:5,TROPONINI:5 in the last 168 hours BNP (last 3 results) No results found for this basename: PROBNP:3  in the last 8760 hours CBG: No results found for this basename: GLUCAP:5 in the last 168 hours  Recent Results (from the past 240 hour(s))  CULTURE, BLOOD (ROUTINE X 2)     Status: Normal (Preliminary result)   Collection Time   02/21/12  1:15 PM      Component Value Range Status Comment   Specimen Description BLOOD LEFT ARM  5 ML IN [facility] BOTTLE   Final    Special Requests NONE   Final    Culture  Setup Time 02/21/2012 19:17   Final    Culture     Final    Value:        BLOOD CULTURE RECEIVED NO GROWTH TO DATE CULTURE WILL BE HELD FOR 5 DAYS BEFORE ISSUING A FINAL NEGATIVE REPORT   Report Status PENDING   Incomplete   CULTURE, BLOOD (ROUTINE X 2)     Status: Normal (Preliminary result)   Collection Time   02/21/12  1:45 PM      Component Value Range Status Comment   Specimen Description BLOOD RIGHT HAND   Final    Special Requests BOTTLES DRAWN AEROBIC ONLY 2.5 CC   Final    Culture  Setup Time 02/21/2012 19:17   Final    Culture     Final    Value:        BLOOD CULTURE RECEIVED NO GROWTH TO DATE CULTURE WILL BE HELD FOR 5 DAYS BEFORE ISSUING A FINAL NEGATIVE REPORT   Report Status PENDING   Incomplete   MRSA PCR SCREENING     Status: Abnormal   Collection Time   02/21/12  4:42 PM      Component Value Range Status Comment   MRSA by PCR POSITIVE (*) NEGATIVE Final      Studies: Dg Chest 2 View  02/21/2012  *RADIOLOGY REPORT*  Clinical Data: Productive cough.  History of pneumonia.  CHEST - 2 VIEW  Comparison: None.  Findings: Heart size is normal.  There are extensive patchy infiltrates throughout both lungs, lower lobe predominant.  This most likely represents widespread bronchopneumonia.  No effusions. No significant bony finding.  The patchy/nodular pattern does raise the possibility of other entities such as sarcoid, masses and Wegener's granulomatosis. Those diagnoses are certainly less likely than ordinary bronchopneumonia.  IMPRESSION: Patchy pulmonary infiltrates  bilaterally, lower lobe predominant, most consistent with bronchopneumonia.  See above.   Original Report Authenticated By: Paulina Fusi, M.D.    Dg Chest Port 1 View  02/22/2012  *RADIOLOGY REPORT*  Clinical Data: Shortness of breath, pneumonia  PORTABLE CHEST - 1 VIEW  Comparison: 02/21/2012  Findings: Cardiac shadow is stable.  Patchy somewhat nodular appearing infiltrate is again identified bilaterally.  The small effusions are now seen bilaterally.  IMPRESSION: New small effusions bilaterally.  Patchy bilateral infiltrate is again noted.   Original Report Authenticated  By: Alcide Clever, M.D.     Scheduled Meds:   . antiseptic oral rinse  15 mL Mouth Rinse BID  . bacitracin   Topical BID  . Chlorhexidine Gluconate Cloth  6 each Topical Q0600  . levofloxacin (LEVAQUIN) IV  750 mg Intravenous Q24H  . mupirocin ointment  1 application Nasal BID  . [COMPLETED] sodium chloride  1,000 mL Intravenous Once  . [COMPLETED] sodium chloride  2,000 mL Intravenous Once  . [COMPLETED] sodium chloride  500 mL Intravenous Once  . vancomycin  750 mg Intravenous Q8H  . [DISCONTINUED] azithromycin  500 mg Intravenous Q24H  . [DISCONTINUED] ceFEPime (MAXIPIME) IV  1 g Intravenous Q8H  . [DISCONTINUED] cefTRIAXone (ROCEPHIN)  IV  1 g Intravenous Q24H  . [DISCONTINUED] enoxaparin  40 mg Subcutaneous Q24H  . [DISCONTINUED] imipenem-cilastatin  500 mg Intravenous Q8H   Continuous Infusions:   . sodium chloride 10 mL/hr (02/22/12 1125)      Time spent: 30  MINUTES    Shenaya Lebo  [facility] Pager [phone]. If 8PM-8AM, please contact night-coverage at [email or web address], password [facility] 02/22/2012, 2:32 PM  LOS: 1 day

## 2012-02-22 NOTE — Progress Notes (Signed)
eLink Physician-Brief Progress Note Patient Name: Beth Larsen DOB: 04-22-1990 MRN: [id number]  Date of Service  02/22/2012   HPI/Events of Note   Anemia   eICU Interventions  Repeat CBC      Beth Larsen 02/22/2012, 4:51 AM

## 2012-02-22 NOTE — Consult Note (Signed)
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 Anxious and a little tearful. No new complaints. Overall feels better   Vital Signs: Temp:  [97.7 F (36.5 C)-101.3 F (38.5 C)] 98.9 F (37.2 C) (11/11 0800) Pulse Rate:  [40-171] 117  (11/11 0800) Resp:  [25-50] 26  (11/11 0800) BP: (84-126)/(49-90) 113/59 mmHg (11/11 0800) SpO2:  [70 %-100 %] 98 % (11/11 0800) Weight:  [51 kg (112 lb 7 oz)] 51 kg (112 lb 7 oz) (11/10 1648)  Physical Examination: General:  Thin, not cachectic, NAD Musculoskeletal:  Early clubbing Skin:  Pale RRR no s3 sinus tach Chest with bilateral coarse rhonchi, + for green sputum Abd soft, ileostomy bag in place Neuro anxious but alert  Principal Problem:  *Sepsis Active Problems:  Bronchiectasis  Recurrent pneumonia  GERD (gastroesophageal reflux disease)  Colitis, acute  Tachycardia  Thrombocytosis  Leukocytosis  Lactic acidosis  Hyponatremia  Acute respiratory failure   ASSESSMENT AND PLAN  PULMONARY  Lab 02/21/12 1442  PHART 7.441  PCO2ART 31.0*  PO2ART 69.7*  HCO3 20.8  O2SAT 92.3     Dg Chest 2 View  02/21/2012  *RADIOLOGY REPORT*  Clinical Data: Productive cough.  History of pneumonia.  CHEST - 2 VIEW  Comparison: None.  Findings: Heart size is normal.  There are extensive patchy infiltrates throughout both lungs, lower lobe predominant.  This most likely represents widespread bronchopneumonia.  No effusions. No significant  bony finding.  The patchy/nodular pattern does raise the possibility of other entities such as sarcoid, masses and Wegener's granulomatosis. Those diagnoses are certainly less likely than ordinary bronchopneumonia.  IMPRESSION: Patchy pulmonary infiltrates bilaterally, lower lobe predominant, most consistent with bronchopneumonia.  See above.   Original Report Authenticated By: Paulina Fusi, M.D.         A:  Pneumonia in pt with bronchiectasis / inflammatory bowel dz P:  Narrow abx as above  CARDIOVASCULAR  Lab 02/22/12 0348 02/21/12 1315  TROPONINI -- --  LATICACIDVEN 1.0 3.2*  PROBNP -- --      A: tachycardia related to fever/ anxiety/ dehydration. +procalcitonin, lactic acid 3.3-> 1.0 Rec Vol expand   RENAL  Lab 02/22/12 0348 02/21/12 1315  NA 131* 124*  K 3.8 4.2  CL 99 85*  CO2 26 21  BUN 4* 11  CREATININE 0.59 0.55  CALCIUM 8.2* 10.3  MG -- --  PHOS -- --   Intake/Output      11/10 0701 - 11/11 0700 11/11 0701 - 11/12 0700   P.O. 100    I.V. (mL/kg) 1750 (34.3) 125 (2.5)   IV Piggyback 550    Total Intake(mL/kg) 2400 (47.1) 125 (2.5)   Urine (mL/kg/hr) 1125 (0.9)    Stool 400    Total Output 1525    Net +875 +125        Urine Occurrence 1 x        Intake/Output Summary (Last 24 hours) at 02/22/12 1022 Last data filed at 02/22/12 0800  Gross per 24 hour  Intake   2525 ml  Output   1525 ml  Net   1000 ml    A:  Hyponatremia ? Due to bronchiectasis/ pna or just inadequate solid intake relative to water  Lab 02/22/12 0348 02/21/12 1315  NA 131* 124*    P:   NS fluid exp  GASTROINTESTINAL  Lab 02/22/12 0348 02/21/12 1315  AST 32 27  ALT 16 16  ALKPHOS 92 120*  BILITOT 0.7 0.9  PROT 6.7 10.3*  ALBUMIN 2.2* 3.4*    A: s/p ileostomy for "colitis" ? Related to bronchiectasis? Not on steroids  HEMATOLOGIC  Lab 02/22/12 0430 02/22/12 0348 02/21/12 1315  HGB 7.5* 7.7* 10.3*  HCT 23.9* 25.2* 32.9*  PLT 483* 510* 984*  INR -- -- --  APTT  -- -- --    A:   Mild anemia/thrombocytosis/hemodilution  P: Transfuse for Hgb<7.0  INFECTIOUS  Lab 02/22/12 0430 02/22/12 0348 02/21/12 1315  WBC 14.6* 14.3* 24.0*  PROCALCITON -- 6.48 0.66   Cultures:  11-10 bc x 2>>  11-10 mrsa swab>> POS 11-10 sputum>>  Antibiotics: Roc 11/10 x one Zmax 11/10 >> 11-10 Pmaxis 11/10 >> 11-10 cefepime 11/10 >> 11/11 Levofloxacin 11/10 >>  Vanco 11-10 >>  A: Severe pneumonia/bronchiectactic exacerbation  MRSA PCR positive P:   abx as above     BEST PRACTICE / DISPOSITION Level of Care:  Step down approp Primary Service:  [facility]:  PCCM Code Status:  Full Diet:  regular DVT Px:  SCDs GI Px:  Not needed Skin Integrity:  ok Social / Family:  No fm    Steve Minor ACNP Adolph Pollack PCCM Pager [phone] till 3 pm If no answer page [phone] 02/22/2012, 10:10 AM   I have interviewed and examined the patient and reviewed the database. I have formulated the assessment and plan as reflected in the note above with amendments made by me. Abx narrowed. CT chest to eval for bchts  Billy Fischer, MD;  PCCM service; Mobile [phone]

## 2012-02-23 DIAGNOSIS — D509 Iron deficiency anemia, unspecified: Secondary | ICD-10-CM

## 2012-02-23 DIAGNOSIS — E876 Hypokalemia: Secondary | ICD-10-CM

## 2012-02-23 LAB — CBC WITH DIFFERENTIAL/PLATELET
Basophils Relative: 0 % (ref 0–1)
Eosinophils Relative: 1 % (ref 0–5)
Hemoglobin: 7.7 g/dL — ABNORMAL LOW (ref 12.0–15.0)
MCH: 21.3 pg — ABNORMAL LOW (ref 26.0–34.0)
Monocytes Absolute: 1.1 10*3/uL — ABNORMAL HIGH (ref 0.1–1.0)
Monocytes Relative: 8 % (ref 3–12)
Neutrophils Relative %: 83 % — ABNORMAL HIGH (ref 43–77)
RBC: 3.61 MIL/uL — ABNORMAL LOW (ref 3.87–5.11)
WBC: 14 10*3/uL — ABNORMAL HIGH (ref 4.0–10.5)

## 2012-02-23 LAB — EXPECTORATED SPUTUM ASSESSMENT W GRAM STAIN, RFLX TO RESP C

## 2012-02-23 LAB — URINE CULTURE: Colony Count: NO GROWTH

## 2012-02-23 LAB — VANCOMYCIN, TROUGH: Vancomycin Tr: 10.5 ug/mL (ref 10.0–20.0)

## 2012-02-23 LAB — BASIC METABOLIC PANEL
BUN: <3 mg/dL — ABNORMAL LOW (ref 6–23)
Chloride: 98 mEq/L (ref 96–112)
GFR calc Af Amer: >90 mL/min (ref >90)
GFR calc non Af Amer: >90 mL/min (ref >90)
Potassium: 2.5 mEq/L — CL (ref 3.5–5.1)
Sodium: 133 mEq/L — ABNORMAL LOW (ref 135–145)

## 2012-02-23 LAB — MAGNESIUM: Magnesium: 1.9 mg/dL (ref 1.5–2.5)

## 2012-02-23 MED ORDER — ALBUTEROL SULFATE (5 MG/ML) 0.5% IN NEBU
2.5000 mg | INHALATION_SOLUTION | Freq: Three times a day (TID) | RESPIRATORY_TRACT | Status: DC
  Administered 2012-02-23 – 2012-02-25 (×6): 2.5 mg via RESPIRATORY_TRACT
  Filled 2012-02-23 (×8): qty 0.5

## 2012-02-23 MED ORDER — MAGNESIUM SULFATE IN D5W 10-5 MG/ML-% IV SOLN
1.0000 g | Freq: Once | INTRAVENOUS | Status: AC
  Administered 2012-02-23: 1 g via INTRAVENOUS
  Filled 2012-02-23: qty 100

## 2012-02-23 MED ORDER — POTASSIUM CHLORIDE CRYS ER 20 MEQ PO TBCR
40.0000 meq | EXTENDED_RELEASE_TABLET | Freq: Once | ORAL | Status: DC
  Filled 2012-02-23: qty 2

## 2012-02-23 MED ORDER — POTASSIUM CHLORIDE CRYS ER 20 MEQ PO TBCR
40.0000 meq | EXTENDED_RELEASE_TABLET | ORAL | Status: AC
  Administered 2012-02-23 (×2): 40 meq via ORAL
  Filled 2012-02-23 (×2): qty 2
  Filled 2012-02-23: qty 1

## 2012-02-23 MED ORDER — LEVOFLOXACIN 500 MG PO TABS
500.0000 mg | ORAL_TABLET | Freq: Every day | ORAL | Status: DC
  Administered 2012-02-23 – 2012-02-24 (×2): 500 mg via ORAL
  Filled 2012-02-23 (×2): qty 1

## 2012-02-23 MED ORDER — POTASSIUM CHLORIDE CRYS ER 20 MEQ PO TBCR
40.0000 meq | EXTENDED_RELEASE_TABLET | Freq: Once | ORAL | Status: AC
  Administered 2012-02-23: 40 meq via ORAL

## 2012-02-23 MED ORDER — POTASSIUM CHLORIDE 10 MEQ/100ML IV SOLN
10.0000 meq | INTRAVENOUS | Status: AC
  Administered 2012-02-23 – 2012-02-24 (×4): 10 meq via INTRAVENOUS
  Filled 2012-02-23 (×4): qty 100

## 2012-02-23 MED ORDER — FERROUS SULFATE 325 (65 FE) MG PO TABS
325.0000 mg | ORAL_TABLET | Freq: Every day | ORAL | Status: DC
  Administered 2012-02-24 – 2012-02-26 (×3): 325 mg via ORAL
  Filled 2012-02-23 (×6): qty 1

## 2012-02-23 MED ORDER — VANCOMYCIN HCL IN DEXTROSE 1-5 GM/200ML-% IV SOLN
1000.0000 mg | Freq: Three times a day (TID) | INTRAVENOUS | Status: DC
  Administered 2012-02-23 – 2012-02-24 (×4): 1000 mg via INTRAVENOUS
  Filled 2012-02-23 (×8): qty 200

## 2012-02-23 MED ORDER — POTASSIUM CHLORIDE CRYS ER 20 MEQ PO TBCR
40.0000 meq | EXTENDED_RELEASE_TABLET | Freq: Once | ORAL | Status: AC
  Administered 2012-02-23: 40 meq via ORAL
  Filled 2012-02-23: qty 2

## 2012-02-23 NOTE — Progress Notes (Signed)
[facility] PROGRESS NOTE  Metztli Sachdev XBM:[id number] DOB: 09-19-1990 DOA: 02/21/2012 PCP: Blenda Mounts, MD  Brief narrative:  21 y.o. 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   Assessment/Plan:  Sepsis  secondary to to pneumonia/ Bronchioectasis. Lactic acid elevated at 3.2, now improved. . Procalcitonin 0.66.  -Continue aggressive IV fluids. Patient informs feeling better overall . HR better but still tachy to low 100s and RR in 30s. -continue IV vanco and levaquin for now ( day 3).  -cough better and remains afebrile.. Follow sputum gm stain and cx. Pending final blood cx.  -continue monitoring in stepdown.   Acute Respiratory Failure/Recurrent Pneumonia  -no clear cause of recurrent symptoms. Patient seen by pulmonary as outpatient in the past. Alpha 1 antitrypsin negative. No family hx of cystic fibrosis.  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 Discussed with PCCM. Cystic fibrosis is another possibility with a late onset of presentation. Will follow with recommendations.   Hyponatremia  Likely due to sepsis, Dehydration ad PNA. Continue IV fluids.  -Level improved in am labs    Dehydration  . Continue IV fluids. Likely in the setting of poor po intake   Hypokalemia  noted for k of 2.5 this  am. Replenish with IV and po kcl. Recheck in pm. Check mg  Underweight/protein calorie malnutrition/BMI 17.1  nutrition consult.   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 Needs to follow up as outpt   Thrombocytosis  Likely related to sepsis. Will follow  Anemia  Drop in H&H noted informs occasional heavy menses. Last hb in the system over 6 mths back of 9.5. . Iron panel  shows low iron stores. B12, folate and TSH wnl. Will start on iron supplements DVT prophylaxis: d/c lovenox.   Code Status: FULL  Family Communication:none at bedisde  Disposition Plan: home once stable   Consultants:  PCCM Procedures:  none Antibiotics:  IV vanco and levaquin : 11/10>>  HPI/Subjective:  Feels better overall. Remains afebrile. HR improved    Objective: Filed Vitals:   02/23/12 0000 02/23/12 0430 02/23/12 0435 02/23/12 0800  BP:   104/69 107/64  Pulse:   101 104  Temp: 98.4 F (36.9 C) 98.6 F (37 C)  98.6 F (37 C)  TempSrc: Oral Oral  Oral  Resp:   37 33  Height:      Weight: 54.4 kg (119 lb 14.9 oz)     SpO2:   99% 99%    Intake/Output Summary (Last 24 hours) at 02/23/12 1015 Last data filed at 02/23/12 0900  Gross per 24 hour  Intake 2152.92 ml  Output   4715 ml  Net -2562.08 ml   Filed Weights   02/21/12 1648 02/23/12 0000  Weight: 51 kg (112 lb 7 oz) 54.4 kg (119 lb 14.9 oz)    Exam:  General: young thin built female lying in bed in NAD HEENT: pallor+ , dry oral mucosa  Cardiovascular: S1&S2 tachycardic, no murmurs  Respiratory: Tachypnic, scatteered rhonchi b/l, coarse crackles over lung bases  Abdomen: soft, NT, ND, BS+  EXT: warm, no edema  CNS: AAOX 3, non focal   Data Reviewed: Basic Metabolic Panel:  Lab 02/23/12 0272 02/22/12 0348 02/21/12 1315  NA 133* 131* 124*  K 2.5* 3.8 4.2  CL 98 99 85*  CO2 26 26 21   GLUCOSE 112* 101* 128*  BUN <3* 4* 11  CREATININE 0.51 0.59 0.55  CALCIUM 8.4 8.2* 10.3  MG 1.9 -- --  PHOS -- -- --   Liver Function Tests:  Lab 02/22/12 0348 02/21/12 1315  AST 32 27  ALT 16 16  ALKPHOS 92 120*  BILITOT 0.7 0.9  PROT 6.7 10.3*  ALBUMIN 2.2* 3.4*   No results found for this basename: LIPASE:5,AMYLASE:5 in the last 168 hours No results found for this basename: AMMONIA:5 in the last 168 hours CBC:  Lab 02/23/12 0331 02/22/12 0430 02/22/12 0348 02/21/12 1315  WBC 14.0* 14.6* 14.3*  24.0*  NEUTROABS 11.7* -- -- 21.1*  HGB 7.7* 7.5* 7.7* 10.3*  HCT 25.8* 23.9* 25.2* 32.9*  MCV 71.5* 70.3* 70.2* 68.8*  PLT 509* 483* 510* 984*   Cardiac Enzymes: No results found for this basename: CKTOTAL:5,CKMB:5,CKMBINDEX:5,TROPONINI:5 in the last 168 hours BNP (last 3 results) No results found for this basename: PROBNP:3 in the last 8760 hours CBG: No results found for this basename: GLUCAP:5 in the last 168 hours  Recent Results (from the past 240 hour(s))  CULTURE, BLOOD (ROUTINE X 2)     Status: Normal (Preliminary result)   Collection Time   02/21/12  1:15 PM      Component Value Range Status Comment   Specimen Description BLOOD LEFT ARM  5 ML IN [facility] BOTTLE   Final    Special Requests NONE   Final    Culture  Setup Time 02/21/2012 19:17   Final    Culture     Final    Value:        BLOOD CULTURE RECEIVED NO GROWTH TO DATE CULTURE WILL BE HELD FOR 5 DAYS BEFORE ISSUING A FINAL NEGATIVE REPORT   Report Status PENDING   Incomplete   CULTURE, BLOOD (ROUTINE X 2)     Status: Normal (Preliminary result)   Collection Time   02/21/12  1:45 PM      Component Value Range Status Comment   Specimen Description BLOOD RIGHT HAND   Final    Special Requests BOTTLES DRAWN AEROBIC ONLY 2.5 CC   Final    Culture  Setup Time 02/21/2012 19:17   Final    Culture     Final    Value:        BLOOD CULTURE RECEIVED NO GROWTH TO DATE CULTURE WILL BE HELD FOR 5 DAYS BEFORE ISSUING A FINAL NEGATIVE REPORT   Report Status PENDING   Incomplete   MRSA PCR SCREENING     Status: Abnormal   Collection Time   02/21/12  4:42 PM      Component Value Range Status Comment   MRSA by PCR POSITIVE (*) NEGATIVE Final   URINE CULTURE     Status: Normal   Collection Time   02/21/12  5:30 PM      Component Value Range Status Comment   Specimen Description URINE, CLEAN CATCH   Final    Special Requests NONE   Final    Culture  Setup Time 02/22/2012 09:12   Final    Colony Count NO GROWTH   Final     Culture NO GROWTH   Final    Report Status 02/23/2012 FINAL   Final      Studies: Dg Chest 2 View  02/21/2012  *RADIOLOGY REPORT*  Clinical Data: Productive cough.  History of pneumonia.  CHEST - 2 VIEW  Comparison: None.  Findings: Heart size is normal.  There are extensive patchy infiltrates throughout both lungs, lower lobe predominant.  This most likely represents widespread bronchopneumonia.  No effusions. No significant bony finding.  The patchy/nodular pattern does raise the possibility of other entities such as sarcoid, masses and Wegener's granulomatosis. Those diagnoses are certainly less likely than ordinary bronchopneumonia.  IMPRESSION: Patchy pulmonary infiltrates bilaterally, lower lobe predominant, most consistent with bronchopneumonia.  See above.   Original Report Authenticated By: Paulina Fusi, M.D.    Ct Chest W Contrast  02/22/2012  *RADIOLOGY REPORT*  Clinical Data: Evaluate infiltrates versus bronchiectasis.  CT CHEST WITH CONTRAST  Technique:  Multidetector CT imaging of the chest was performed following the standard protocol during bolus administration of intravenous contrast.  Contrast: 60mL OMNIPAQUE IOHEXOL 300 MG/ML  SOLN  Comparison: Chest radiograph 02/22/2012.  Findings: Mediastinal lymph nodes measure up to 12 mm in the right paratracheal station.  Bihilar lymphoid tissue.  No axillary adenopathy.  Heart is mildly enlarged.  No pericardial effusion.  There are cystic/cavitary lesions in the lungs bilaterally, many of which are thick-walled and contain internal fluid.  Some areas of nodular airspace consolidation are seen as well.  Airspace consolidation is most severe in the right lower lobe with a tiny right pleural effusion.  Fluid-filled cystic bronchiectasis in the left lower lobe.  Bibasilar predominant diffuse peribronchovascular nodularity.  Possible trace left pleural fluid.  Airway is otherwise unremarkable.  Incidental imaging of the upper abdomen shows no acute  findings. No worrisome lytic or sclerotic lesions.  IMPRESSION:  1.  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  Allergic bronchopulmonary aspergillosis (ABPA) is another consideration.  Cystic fibrosis is considered less likely given the patient's age. 2.  Probable reactive mediastinal adenopathy. 3.  Tiny bilateral pleural effusions.   Original Report Authenticated By: Leanna Battles, M.D.    Dg Chest Port 1 View  02/22/2012  *RADIOLOGY REPORT*  Clinical Data: Shortness of breath, pneumonia  PORTABLE CHEST - 1 VIEW  Comparison: 02/21/2012  Findings: Cardiac shadow is stable.  Patchy somewhat nodular appearing infiltrate is again identified bilaterally.  The small effusions are now seen bilaterally.  IMPRESSION: New small effusions bilaterally.  Patchy bilateral infiltrate is again noted.   Original Report Authenticated By: Alcide Clever, M.D.     Scheduled Meds:   . albuterol  2.5 mg Nebulization Q8H  . antiseptic oral rinse  15 mL Mouth Rinse BID  . bacitracin   Topical BID  . Chlorhexidine Gluconate Cloth  6 each Topical Q0600  . levofloxacin  500 mg Oral Daily  . mupirocin ointment  1 application Nasal BID  . [COMPLETED] potassium chloride  40 mEq Oral Q1 Hr x 2  . [COMPLETED] potassium chloride  40 mEq Oral Once  . [COMPLETED] sodium chloride  500 mL Intravenous Once  . vancomycin  750 mg Intravenous Q8H  . [DISCONTINUED] ceFEPime (MAXIPIME) IV  1 g Intravenous Q8H  . [DISCONTINUED] levofloxacin (LEVAQUIN) IV  750 mg Intravenous Q24H   Continuous Infusions:   . sodium chloride 10 mL/hr (02/22/12 1125)     Time spent: 35 MINUTES    Joson Sapp  [facility] Pager [phone]. If 8PM-8AM, please contact night-coverage at  [email or web address], password [facility] 02/23/2012, 10:15 AM  LOS: 2 days

## 2012-02-23 NOTE — Progress Notes (Signed)
CRITICAL VALUE ALERT  Critical value received:  K 2.6  Date of notification:  02/23/12  Time of notification:  1819  Critical value read back:yes  Nurse who received alert:  Iran Ouch, RN  MD notified (1st page):  Dr. Gonzella Lex  Time of first page:  1828  MD notified (2nd page):  Time of second page:  Responding MD:  Dr. Gonzella Lex  Time MD responded:  [phone]

## 2012-02-23 NOTE — Progress Notes (Signed)
ANTIBIOTIC CONSULT NOTE - INITIAL  Pharmacy Consult for vancomycin, levofloxaxin Indication: pneumonia  Allergies  Allergen Reactions  . Augmentin (Amoxicillin-Pot Clavulanate) Rash    Patient Measurements: Height: 5\' 8"  (172.7 cm) (Simultaneous filing. User may not have seen previous data.) Weight: 119 lb 14.9 oz (54.4 kg) IBW/kg (Calculated) : 63.9   Vital Signs: Temp: 98.6 F (37 C) (11/12 0800) Temp src: Oral (11/12 0800) BP: 108/67 mmHg (11/12 1200) Pulse Rate: 102  (11/12 1200) Intake/Output from previous day: 11/11 0701 - 11/12 0700 In: 2517.9 [P.O.:1200; I.V.:717.9; IV Piggyback:600] Out: 4590 [Urine:3400; Stool:1190] Intake/Output from this shift: Total I/O In: 560 [P.O.:360; I.V.:50; IV Piggyback:150] Out: 1225 [Urine:775; Stool:450]  Labs:  [facility] 02/23/12 0331 02/22/12 0430 02/22/12 0348 02/21/12 1315  WBC 14.0* 14.6* 14.3* --  HGB 7.7* 7.5* 7.7* --  PLT 509* 483* 510* --  LABCREA -- -- -- --  CREATININE 0.51 -- 0.59 0.55   Estimated Creatinine Clearance: 96.3 ml/min (by C-G formula based on Cr of 0.51).  Basename 02/23/12 1145  VANCOTROUGH 10.5  VANCOPEAK --  VANCORANDOM --  GENTTROUGH --  GENTPEAK --  GENTRANDOM --  TOBRATROUGH --  TOBRAPEAK --  TOBRARND --  AMIKACINPEAK --  AMIKACINTROU --  AMIKACIN --     Microbiology: Recent Results (from the past 720 hour(s))  CULTURE, BLOOD (ROUTINE X 2)     Status: Normal (Preliminary result)   Collection Time   02/21/12  1:15 PM      Component Value Range Status Comment   Specimen Description BLOOD LEFT ARM  5 ML IN [facility] BOTTLE   Final    Special Requests NONE   Final    Culture  Setup Time 02/21/2012 19:17   Final    Culture     Final    Value:        BLOOD CULTURE RECEIVED NO GROWTH TO DATE CULTURE WILL BE HELD FOR 5 DAYS BEFORE ISSUING A FINAL NEGATIVE REPORT   Report Status PENDING   Incomplete   CULTURE, BLOOD (ROUTINE X 2)     Status: Normal (Preliminary result)   Collection Time   02/21/12  1:45 PM      Component Value Range Status Comment   Specimen Description BLOOD RIGHT HAND   Final    Special Requests BOTTLES DRAWN AEROBIC ONLY 2.5 CC   Final    Culture  Setup Time 02/21/2012 19:17   Final    Culture     Final    Value:        BLOOD CULTURE RECEIVED NO GROWTH TO DATE CULTURE WILL BE HELD FOR 5 DAYS BEFORE ISSUING A FINAL NEGATIVE REPORT   Report Status PENDING   Incomplete   MRSA PCR SCREENING     Status: Abnormal   Collection Time   02/21/12  4:42 PM      Component Value Range Status Comment   MRSA by PCR POSITIVE (*) NEGATIVE Final   URINE CULTURE     Status: Normal   Collection Time   02/21/12  5:30 PM      Component Value Range Status Comment   Specimen Description URINE, CLEAN CATCH   Final    Special Requests NONE   Final    Culture  Setup Time 02/22/2012 09:12   Final    Colony Count NO GROWTH   Final    Culture NO GROWTH   Final    Report Status 02/23/2012 FINAL   Final     Medical History: Past  Medical History  Diagnosis Date  . Cough   . GERD (gastroesophageal reflux disease)   . PNA (pneumonia)   . Colitis, acute     Medications:  Anti-infectives     Start     Dose/Rate Route Frequency Ordered Stop   02/23/12 1200   levofloxacin (LEVAQUIN) tablet 500 mg        500 mg Oral Daily 02/23/12 1011     02/21/12 2000   vancomycin (VANCOCIN) 750 mg in sodium chloride 0.9 % 150 mL IVPB        750 mg 150 mL/hr over 60 Minutes Intravenous Every 8 hours 02/21/12 1714 02/29/12 1959   02/21/12 1800   ceFEPIme (MAXIPIME) 1 g in dextrose 5 % 50 mL IVPB  Status:  Discontinued        1 g 100 mL/hr over 30 Minutes Intravenous Every 8 hours 02/21/12 1641 02/22/12 1117   02/21/12 1800   levofloxacin (LEVAQUIN) IVPB 750 mg  Status:  Discontinued        750 mg 100 mL/hr over 90 Minutes Intravenous Every 24 hours 02/21/12 1641 02/23/12 1011   02/21/12 1600   imipenem-cilastatin (PRIMAXIN) 500 mg in sodium chloride 0.9 % 100 mL IVPB  Status:   Discontinued        500 mg 200 mL/hr over 30 Minutes Intravenous 3 times per day 02/21/12 1548 02/21/12 1655   02/21/12 1430   cefTRIAXone (ROCEPHIN) 1 g in dextrose 5 % 50 mL IVPB  Status:  Discontinued        1 g 100 mL/hr over 30 Minutes Intravenous Every 24 hours 02/21/12 1426 02/21/12 1548   02/21/12 1430   azithromycin (ZITHROMAX) 500 mg in dextrose 5 % 250 mL IVPB  Status:  Discontinued        500 mg 250 mL/hr over 60 Minutes Intravenous Every 24 hours 02/21/12 1426 02/21/12 1641         Assessment: 21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 Admitted with SOB and green sputum. Dx broncopneumonia/bronchiectasis  1/10 >> Azithromycin(MD) >> 11/10 11/10 >> Primaxin >> 11/10 11/10 >> Vanco x8d >> 11/10 >> Levaquin x3d >> 11/10 >> Cefepime >>11/11  Tmax: Afebrile WBCs: 14 (improving) Renal: WNL, CrCl=90 PCT: 0.66 --> 6.48  11/10 blood: NGTD  11/10 Urine: NG MRSA by PCR = +  Vancomycin trough 11/12 = 10.5 mcg/ml on 750mg  IV q8h  Goal of Therapy:  Vancomycin trough level 15-20 mcg/ml  Plan:  - Change vancomycin to 1gm IV q8h for est new peak = 39 and trough = 14 mcg/ml - Recheck trough in 2-3 days if remains on vancoycin - Monitor renal function - Continue levofloxacin as ordered  Juliette Alcide, PharmD, BCPS.   Pager: [phone] 02/23/2012,1:05 PM

## 2012-02-23 NOTE — Progress Notes (Signed)
Noted transfer order in at 17:26. Patient with room 1406, report called to [facility]. Pt transported on cardiac monitor. See Doc flow sheets for vital signs. Noted respiratory to retrieve PT machine, pt was moved to rm 1406. 4thflr staff at bedside to help with transfer. Pt remained on contact precautions. Sputum sample sent to lab @ 1930. Chart placed in 4th floor wallaroo.

## 2012-02-23 NOTE — Progress Notes (Addendum)
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 Some areas of nodular airspace consolidation are seen as well. Airspace consolidation is most severe in the right lower lobe with a tiny right pleural effusion. Fluid-filled cystic bronchiectasis in the left lower lobe   SUBJ:  11-12 more comfortable    Vital Signs: Temp:  [98.4 F (36.9 C)-99.2 F (37.3 C)] 98.6 F (37 C) (11/12 0430) Pulse Rate:  [101-135] 101  (11/12 0435) Resp:  [25-37] 37  (11/12 0435) BP: (99-124)/(55-88) 104/69 mmHg (11/12 0435) SpO2:  [98 %-100 %] 99 % (11/12 0435) Weight:  [54.4 kg (119 lb 14.9 oz)] 54.4 kg (119 lb 14.9 oz) (11/12 0000)  Physical Examination: General:  Thin, not cachectic, NAD Musculoskeletal:  Early clubbing Skin:  Pale RRR no s3 sinus tach Chest with bilateral coarse rhonchi, Abd soft, ileostomy bag in place Neuro anxious but alert   BMET    Component Value Date/Time   NA 133* 02/23/2012 0331   K 2.5* 02/23/2012 0331   CL 98 02/23/2012 0331   CO2 26 02/23/2012 0331   GLUCOSE 112* 02/23/2012 0331   BUN <3* 02/23/2012 0331   CREATININE 0.51 02/23/2012 0331   CALCIUM 8.4 02/23/2012 0331   GFRNONAA >90 02/23/2012 0331   GFRAA >90 02/23/2012 0331    CBC    Component Value Date/Time   WBC 14.0* 02/23/2012 0331   RBC 3.61* 02/23/2012 0331   HGB 7.7* 02/23/2012 0331   HCT  25.8* 02/23/2012 0331   PLT 509* 02/23/2012 0331   MCV 71.5* 02/23/2012 0331   MCH 21.3* 02/23/2012 0331   MCHC 29.8* 02/23/2012 0331   RDW 17.5* 02/23/2012 0331   LYMPHSABS 1.1 02/23/2012 0331   MONOABS 1.1* 02/23/2012 0331   EOSABS 0.1 02/23/2012 0331   BASOSABS 0.0 02/23/2012 0331    No new CXR  Active Problems:   Recurrent pneumonia and 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   DDX inc CF, immotile cilia syndrome, immunoglobulin deficiency, idiopathic   Tachycardia, improving  Hyponatremia, mild  Acute respiratory failure  resolving  Anemia, iron deficient  Likely menstrual losses and possible component of nutritional deficiency   PLAN Cont Vanc (MRSA nasal swab +) Change levofloxacin to PO Complete 14-21d abx total - will not likely need Vanc for this duration Begin chest percussion vest and nebulized albuterol 11/12 to facilitate mucus clearance CF study ordered IgG, IgA, IgM levels ordered Testing for ciliary dysfunction probably requires eval at specialized center [place] for transfer to med-surg bed  I discussed in detail with patient and spoke with her father over phone. She will need close pulmonary follow up after discharge I will continue to follow her until discharge and help set up discharge equipment and follow up  Billy Fischer, MD ; [facility] [phone].  After 5:30 PM or weekends, call [phone]

## 2012-02-24 ENCOUNTER — Inpatient Hospital Stay (HOSPITAL_COMMUNITY)

## 2012-02-24 LAB — CBC
HCT: 28 % — ABNORMAL LOW (ref 36.0–46.0)
MCHC: 29.3 g/dL — ABNORMAL LOW (ref 30.0–36.0)
MCV: 71.1 fL — ABNORMAL LOW (ref 78.0–100.0)
RDW: 17.7 % — ABNORMAL HIGH (ref 11.5–15.5)

## 2012-02-24 LAB — BASIC METABOLIC PANEL
BUN: <3 mg/dL — ABNORMAL LOW (ref 6–23)
CO2: 28 mEq/L (ref 19–32)
Chloride: 95 mEq/L — ABNORMAL LOW (ref 96–112)
Creatinine, Ser: 0.51 mg/dL (ref 0.50–1.10)

## 2012-02-24 LAB — IGG, IGA, IGM
IgA: 611 mg/dL — ABNORMAL HIGH (ref 69–380)
IgG (Immunoglobin G), Serum: 2210 mg/dL — ABNORMAL HIGH (ref 690–1700)

## 2012-02-24 MED ORDER — POTASSIUM CHLORIDE CRYS ER 20 MEQ PO TBCR
40.0000 meq | EXTENDED_RELEASE_TABLET | Freq: Once | ORAL | Status: AC
  Administered 2012-02-24: 40 meq via ORAL
  Filled 2012-02-24: qty 2

## 2012-02-24 MED ORDER — LEVOFLOXACIN 500 MG PO TABS
500.0000 mg | ORAL_TABLET | Freq: Every day | ORAL | Status: DC
  Administered 2012-02-25 – 2012-02-26 (×2): 500 mg via ORAL
  Filled 2012-02-24 (×2): qty 1

## 2012-02-24 NOTE — Progress Notes (Signed)
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 Some areas of nodular airspace consolidation are seen as well. Airspace consolidation is most severe in the right lower lobe with a tiny right pleural effusion. Fluid-filled cystic bronchiectasis in the left lower lobe   SUBJ:  11-13 more comfortable    Vital Signs: Temp:  [98 F (36.7 C)-99.5 F (37.5 C)] 98.8 F (37.1 C) (11/13 0525) Pulse Rate:  [102-127] 112  (11/13 0525) Resp:  [22-26] 23  (11/13 0525) BP: (108-115)/(58-74) 110/58 mmHg (11/13 0525) SpO2:  [89 %-100 %] 96 % (11/13 0525) Weight:  [51.393 kg (113 lb 4.8 oz)] 51.393 kg (113 lb 4.8 oz) (11/12 2157)  Physical Examination: General:  Thin, not cachectic, NAD, more animated and interactive Musculoskeletal:  Early clubbing Skin:  Pale RRR   Chest with bilateral coarse rhonchi, Abd soft, ileostomy bag in place Neuro : alert   BMET    Component Value Date/Time   NA 133* 02/24/2012 0502   K 3.4* 02/24/2012 0502   CL 95* 02/24/2012 0502   CO2 28 02/24/2012 0502   GLUCOSE 90 02/24/2012 0502   BUN <3* 02/24/2012 0502   CREATININE 0.51 02/24/2012 0502   CALCIUM 9.1 02/24/2012 0502   GFRNONAA >90 02/24/2012 0502   GFRAA >90 02/24/2012 0502    CBC    Component Value Date/Time   WBC 7.0 02/24/2012 0502   RBC 3.94 02/24/2012 0502   HGB 8.2* 02/24/2012 0502   HCT 28.0* 02/24/2012 0502   PLT 562* 02/24/2012 0502   MCV 71.1* 02/24/2012 0502   MCH 20.8* 02/24/2012 0502   MCHC 29.3* 02/24/2012 0502   RDW 17.7* 02/24/2012 0502   LYMPHSABS 1.1 02/23/2012 0331   MONOABS 1.1* 02/23/2012 0331   EOSABS 0.1 02/23/2012 0331   BASOSABS 0.0 02/23/2012 0331      02/24/2012  *RADIOLOGY REPORT*  Clinical Data: Pneumonia.  PORTABLE CHEST - 1 VIEW  Comparison: 02/22/2012  Findings: Patchy bilateral airspace disease and nodular airspace disease again noted, unchanged.  Small bilateral effusions and cardiomegaly.  No acute bony abnormality.  IMPRESSION: No significant change.   Original Report Authenticated By: Charlett Nose, M.D.     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   DDX inc CF, immotile cilia syndrome, immunoglobulin deficiency, idiopathic   Tachycardia, improving  Hyponatremia, mild  Acute respiratory failure  resolving  Anemia, iron deficient  Likely menstrual losses and possible component of nutritional deficiency   PLAN Cont Vanc (MRSA nasal swab +) Change levofloxacin to PO Complete 14-21d abx total - will not likely need Vanc for this duration Begin chest percussion vest and nebulized albuterol 11/12 to facilitate mucus clearance CF study ordered IgG, IgA, IgM levels ordered Testing for ciliary dysfunction probably requires eval at specialized center [place] for transfer to med-surg bed   Brett Canales Minor ACNP Adolph Pollack  PCCM Pager [phone] till 3 pm If no answer page [phone] 02/24/2012, 10:46 AM   PCCM Attending:  I have interviewed and examined the patient and reviewed the database. I have formulated the assessment and plan as reflected in the note above with amendments made by me.   She should be ready for discharge in next day or two. I would like to know whether she grows MRSA in the resp culture prior to discharge. PCCM service will sign off. Please call if  we can be of further assistance.  Following are my parting recommendations:  1) Complete a 3 wk course of abx for PNA and acute exacerbation of bronchiectasis   If no MRSA cultured in resp specimen, Levofloxacin alone should suffice  If MRSA +, suggest Linezolid and Levofloxacin for the duration of the 3 wk course  2) Discharge with chest percussion vest and nebulized albuterol  Chest percussion X 10-15 mins BID  Nebulized albuterol 2.5 mg BID (may be done @ same time as chest percussion  Either or both may also be done q 6 hrs PRN at her discretion  I have made request of care management to procure these home health needs  3) I encouraged her to work on weight gain to give her a little more reserve in event of recurrent respiratory infections  Suggested target wt of 125# (15 lb wt gain)  4) I have sceheduled F/U with Dr Sherene Sires, [facility]  03/09/12 @ 10:00 AM for CXR and ROV   Please note that genetic testing for CF has been sent and is not yet resulted It does not appear that we have the capacity for testing for immotile cilia syndrome in this hospital system  Might warrant referral to a specialty center to investigate this possibility further   Billy Fischer, MD;  PCCM service; Mobile [phone]

## 2012-02-24 NOTE — Care Management Note (Addendum)
    Page 1 of 2   02/26/2012     2:11:40 PM   CARE MANAGEMENT NOTE 02/26/2012  Patient:  Beth Larsen, Beth Larsen   Account Number:  [id number]  Date Initiated:  02/24/2012  Documentation initiated by:  Lanier Clam  Subjective/Objective Assessment:   ADMITTED W/RECURRENT PNA.ZO:[id number].     Action/Plan:   FROM HOME-[employer] STUDENT LIVES IN [place].   Anticipated DC Date:  02/26/2012   Anticipated DC Plan:  HOME/SELF CARE      DC Planning Services  CM consult      Choice offered to / List presented to:  C-1 Patient   DME arranged  NEBULIZER MACHINE      DME agency  [facility]        Status of service:  Completed, signed off Medicare Important Message given?   (If response is "NO", the following Medicare IM given date fields will be blank) Date Medicare IM given:   Date Additional Medicare IM given:    Discharge Disposition:  HOME/SELF CARE  Per UR Regulation:  Reviewed for med. necessity/level of care/duration of stay  If discussed at Long Length of Stay Meetings, dates discussed:    Comments:  02/26/12 Zadkiel Dragan RN,BSN NCM [phone] [facility]-TO BE DELIVERED TO PATIENT HOME TODAY.[facility]-DME NEB MACHINE BROUGHT TO RM.D/C HOME NO FURTHER NEEDS.  02/25/12 Elayah Klooster RN,BSN NCM [phone] PATIENT WILL NEED A CHEST PHYSICAL THERAPY VEST.[facility]-CONTACT PERSON DENISE ALLEN ACCT SERVICE REP TEL#[phone],FAXED SCRIPT/TERM FORM-SIGNED BY PATIENT.FACE SHEET,H&P,PROGRESS NOTES TO [phone] W/CONFIRMATION.THEY DELIVER OVERNIGHT TO THE HOME ONCE PATIENT D/C, & IS APPROPVED.THEY PROVIDE TRAINING. WILL AWAIT A RESPONSE FROM [facility].WILL NEED ORDER FOR HOME NEB MACHINE.PATIENT PLANS TO D/C TO HER HOME IN [facility] W/HER MOM.  02/24/12 Konni Kesinger RN,BSN NCM [phone] RECEIVED RETURN CALL FROM ASHLEY @ [facility],SHE HAS FAXED LIFE VEST FORM TO ME FOR PULMONARY COMPLETION ON CHART.ASHLEY WILL BE HERE @ HOSPITAL TOMORROW TO ASSIST W/LIFE  VEST PROCESS.WILL FAX ASHLEY FACE SHEET,H&P,PROGRESS NOTES,CONSULT NOTES.[facility] DME WILL BE ABLE TO PROVIDE NEB MACHINE.PATIENT HAS PHARMACY WHERE SHE CAN GET NEB MED FILLED. TRANSFER FROM SDU.TC [facility] TEL#[phone],C#[phone] LEFT VM W/ASHLEY STEWART,W/CALL BACK #,WILL AWAIT RESPONSE.IF NEBS,& MACHINE CAN ARRANGE W/[facility]-DME W/ORDERS.

## 2012-02-24 NOTE — Progress Notes (Signed)
Patient seen and examined by me.  Agree with plan.  Await culture- appreciate pulm's assistance.  Patient will need close outpatient follow up for diagnosis.  Marlin Canary DO

## 2012-02-24 NOTE — Progress Notes (Signed)
Pt's HR increased to 148 and sustained for about 3 minutes. During this time pt was having an episode of coughing. Pt stable and HR began to increase after a few minutes. NP on floor made aware. No new orders at this time. Will continue to monitor pt.   Arta Bruce [facility]

## 2012-02-24 NOTE — Progress Notes (Signed)
Ambulated with pt approx 240 feet. Pt tolerated ambulation well. HR increased to 150's with ambulation, but decreased once pt returned to chair. MD aware of pt tachycardia with exertion. Will continue to monitor. Socrates Cahoon K

## 2012-02-24 NOTE — Progress Notes (Signed)
[facility] PROGRESS NOTE  Beth Larsen DGU:[id number] DOB: October 23, 1990 DOA: 02/21/2012 PCP: Blenda Mounts, MD  Assessment/Plan:  Sepsis. secondary to to pneumonia/ Bronchioectasis. Resolved.  Lactic acid down to 1.0 on 02/22/12. Procalcitonin 0.66. Will saline lock.  Patient feeling better overall . HR better but still tachy to low 100s and RR in 20s.  -continue IV vanco. Change levaquin to po. (day#4) Cough better and remains afebrile.. Follow sputum gm stain and cx.    Acute Respiratory Failure/Recurrent Pneumonia  -resolved 02/24/12 -no clear cause of recurrent symptoms. Patient seen by pulmonary as outpatient in the past. Alpha 1 antitrypsin negative. No family hx of cystic fibrosis. 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 Appreciate  PCCM. Will follow with recommendations.  Hyponatremia  Likely due to sepsis, Dehydration ad PNA. Stable at 133. Will recheck in am.   Dehydration  . Po intake improved. Will saline lock.   Hypokalemia  Trending upward. Mild. Will replete and recheck in am.  mg 1.9 Underweight/protein calorie malnutrition/BMI 17.1  nutrition consult.  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 Needs to follow up as outpt  Thrombocytosis  Likely related to sepsis. Stable.  Will follow  Anemia  Drop in H&H noted informs occasional heavy menses. Last hb in the system over 6 mths back of 9.5. . Iron panel shows low iron stores. B12, folate and TSH wnl. Will start on iron supplements. Hg 8.2 today   DVT prophylaxis: d/c lovenox.   Code Status: full Family Communication:  Disposition Plan: home with mother hopefully   Consultants:  PCCM  Procedures:  none  Antibiotics:  Vanc 11/10>>>>>  Levaquin 02/21/12>>>>  HPI/Subjective: Sitting up in bed watching TV. NAD. Denies  pain/discomfort  Objective: Filed Vitals:   02/23/12 1800 02/23/12 2126 02/23/12 2157 02/24/12 0525  BP: 115/72 109/74 112/74 110/58  Pulse: 110  127 112  Temp:  98.5 F (36.9 C) 99.5 F (37.5 C) 98.8 F (37.1 C)  TempSrc:  Oral Oral Oral  Resp: 26  22 23   Height:   5\' 8"  (1.727 m)   Weight:   51.393 kg (113 lb 4.8 oz)   SpO2: 97%  100% 96%    Intake/Output Summary (Last 24 hours) at 02/24/12 1255 Last data filed at 02/24/12 1109  Gross per 24 hour  Intake    600 ml  Output   3150 ml  Net  -2550 ml   Filed Weights   02/21/12 1648 02/23/12 0000 02/23/12 2157  Weight: 51 kg (112 lb 7 oz) 54.4 kg (119 lb 14.9 oz) 51.393 kg (113 lb 4.8 oz)    Exam:   General:  Awake alert very thin and somewhat pale with flushed cheeks  Cardiovascular: tachycardic, regular, No MGR No LEE  Respiratory: normal effort. BS very diminished. No rhonchi, faint fine crackles left base  Abdomen: flat soft +BS non-tender to palpation  MS MOE no joint swelling/erythema clubbing   Data Reviewed: Basic Metabolic Panel:  Lab 02/24/12 9563 02/23/12 1645 02/23/12 0331 02/22/12 0348 02/21/12 1315  NA 133* -- 133* 131* 124*  K 3.4* 2.6* 2.5* 3.8 4.2  CL 95* -- 98 99 85*  CO2 28 -- 26 26 21   GLUCOSE 90 -- 112* 101* 128*  BUN <3* -- <3* 4* 11  CREATININE 0.51 -- 0.51 0.59 0.55  CALCIUM 9.1 -- 8.4 8.2* 10.3  MG -- -- 1.9 -- --  PHOS -- -- -- -- --   Liver Function Tests:  Lab 02/22/12 0348 02/21/12 1315  AST 32 27  ALT 16 16  ALKPHOS 92 120*  BILITOT 0.7 0.9  PROT 6.7 10.3*  ALBUMIN 2.2* 3.4*   No results found for this basename: LIPASE:5,AMYLASE:5 in the last 168 hours No results found for this basename: AMMONIA:5 in the last 168 hours CBC:  Lab 02/24/12 0502 02/23/12 0331 02/22/12 0430 02/22/12 0348 02/21/12 1315  WBC 7.0 14.0* 14.6* 14.3* 24.0*  NEUTROABS -- 11.7* -- -- 21.1*  HGB 8.2* 7.7* 7.5* 7.7* 10.3*  HCT 28.0* 25.8* 23.9* 25.2* 32.9*  MCV 71.1* 71.5* 70.3* 70.2* 68.8*   PLT 562* 509* 483* 510* 984*   Cardiac Enzymes: No results found for this basename: CKTOTAL:5,CKMB:5,CKMBINDEX:5,TROPONINI:5 in the last 168 hours BNP (last 3 results) No results found for this basename: PROBNP:3 in the last 8760 hours CBG: No results found for this basename: GLUCAP:5 in the last 168 hours  Recent Results (from the past 240 hour(s))  CULTURE, BLOOD (ROUTINE X 2)     Status: Normal (Preliminary result)   Collection Time   02/21/12  1:15 PM      Component Value Range Status Comment   Specimen Description BLOOD LEFT ARM  5 ML IN [facility] BOTTLE   Final    Special Requests NONE   Final    Culture  Setup Time 02/21/2012 19:17   Final    Culture     Final    Value:        BLOOD CULTURE RECEIVED NO GROWTH TO DATE CULTURE WILL BE HELD FOR 5 DAYS BEFORE ISSUING A FINAL NEGATIVE REPORT   Report Status PENDING   Incomplete   CULTURE, BLOOD (ROUTINE X 2)     Status: Normal (Preliminary result)   Collection Time   02/21/12  1:45 PM      Component Value Range Status Comment   Specimen Description BLOOD RIGHT HAND   Final    Special Requests BOTTLES DRAWN AEROBIC ONLY 2.5 CC   Final    Culture  Setup Time 02/21/2012 19:17   Final    Culture     Final    Value:        BLOOD CULTURE RECEIVED NO GROWTH TO DATE CULTURE WILL BE HELD FOR 5 DAYS BEFORE ISSUING A FINAL NEGATIVE REPORT   Report Status PENDING   Incomplete   MRSA PCR SCREENING     Status: Abnormal   Collection Time   02/21/12  4:42 PM      Component Value Range Status Comment   MRSA by PCR POSITIVE (*) NEGATIVE Final   URINE CULTURE     Status: Normal   Collection Time   02/21/12  5:30 PM      Component Value Range Status Comment   Specimen Description URINE, CLEAN CATCH   Final    Special Requests NONE   Final    Culture  Setup Time 02/22/2012 09:12   Final    Colony Count NO GROWTH   Final    Culture NO GROWTH   Final    Report Status 02/23/2012 FINAL   Final   CULTURE, EXPECTORATED SPUTUM-ASSESSMENT     Status:  Normal   Collection Time   02/23/12  7:12 PM      Component Value Range Status Comment   Specimen Description SPUTUM   Final    Special Requests NONE   Final  Sputum evaluation     Final    Value: THIS SPECIMEN IS ACCEPTABLE. RESPIRATORY CULTURE REPORT TO FOLLOW.   Report Status 02/23/2012 FINAL   Final   CULTURE, RESPIRATORY     Status: Normal (Preliminary result)   Collection Time   02/23/12  7:12 PM      Component Value Range Status Comment   Specimen Description SPUTUM   Final    Special Requests NONE   Final    Gram Stain     Final    Value: MODERATE WBC PRESENT,BOTH PMN AND MONONUCLEAR     NO SQUAMOUS EPITHELIAL CELLS SEEN     FEW GRAM POSITIVE COCCI IN CLUSTERS     IN PAIRS   Culture PENDING   Incomplete    Report Status PENDING   Incomplete      Studies: Ct Chest W Contrast  02/22/2012  *RADIOLOGY REPORT*  Clinical Data: Evaluate infiltrates versus bronchiectasis.  CT CHEST WITH CONTRAST  Technique:  Multidetector CT imaging of the chest was performed following the standard protocol during bolus administration of intravenous contrast.  Contrast: 60mL OMNIPAQUE IOHEXOL 300 MG/ML  SOLN  Comparison: Chest radiograph 02/22/2012.  Findings: Mediastinal lymph nodes measure up to 12 mm in the right paratracheal station.  Bihilar lymphoid tissue.  No axillary adenopathy.  Heart is mildly enlarged.  No pericardial effusion.  There are cystic/cavitary lesions in the lungs bilaterally, many of which are thick-walled and contain internal fluid.  Some areas of nodular airspace consolidation are seen as well.  Airspace consolidation is most severe in the right lower lobe with a tiny right pleural effusion.  Fluid-filled cystic bronchiectasis in the left lower lobe.  Bibasilar predominant diffuse peribronchovascular nodularity.  Possible trace left pleural fluid.  Airway is otherwise unremarkable.  Incidental imaging of the upper abdomen shows no acute findings. No worrisome lytic or sclerotic  lesions.  IMPRESSION:  1.  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  Allergic bronchopulmonary aspergillosis (ABPA) is another consideration.  Cystic fibrosis is considered less likely given the patient's age. 2.  Probable reactive mediastinal adenopathy. 3.  Tiny bilateral pleural effusions.   Original Report Authenticated By: Leanna Battles, M.D.    Dg Chest Port 1 View  02/24/2012  *RADIOLOGY REPORT*  Clinical Data: Pneumonia.  PORTABLE CHEST - 1 VIEW  Comparison: 02/22/2012  Findings: Patchy bilateral airspace disease and nodular airspace disease again noted, unchanged.  Small bilateral effusions and cardiomegaly.  No acute bony abnormality.  IMPRESSION: No significant change.   Original Report Authenticated By: Charlett Nose, M.D.     Scheduled Meds:   . albuterol  2.5 mg Nebulization Q8H  . antiseptic oral rinse  15 mL Mouth Rinse BID  . Chlorhexidine Gluconate Cloth  6 each Topical Q0600  . ferrous sulfate  325 mg Oral Q breakfast  . levofloxacin  500 mg Oral Daily  . [COMPLETED] magnesium sulfate 1 - 4 g bolus IVPB  1 g Intravenous Once  . mupirocin ointment  1 application Nasal BID  . [COMPLETED] potassium chloride  10 mEq Intravenous Q1 Hr x 4  . potassium chloride  40 mEq Oral Once  . [COMPLETED] potassium chloride  40 mEq Oral Once  . [COMPLETED] potassium chloride  40 mEq Oral Once  . vancomycin  1,000 mg Intravenous Q8H  . [  DISCONTINUED] bacitracin   Topical BID  . [DISCONTINUED] vancomycin  750 mg Intravenous Q8H   Continuous Infusions:   . sodium chloride 10 mL/hr (02/22/12 1125)    Active Problems:  Bronchiectasis  Recurrent pneumonia  Tachycardia  Hyponatremia  Acute respiratory failure  Anemia  Hypokalemia  Iron  deficiency anemia    Time spent: 35 minutes    [facility] M NP [facility]  If 8PM-8AM, please contact night-coverage at [email or web address], password [facility] 02/24/2012, 12:55 PM  LOS: 3 days

## 2012-02-24 NOTE — Progress Notes (Signed)
Pt gagged on potassium pill and vomited about 150cc. Small piece of pill noticed in emesis. Will continue to monitor. Deajah Erkkila K

## 2012-02-25 LAB — VANCOMYCIN, TROUGH: Vancomycin Tr: 26.6 ug/mL (ref 10.0–20.0)

## 2012-02-25 LAB — BASIC METABOLIC PANEL
BUN: 4 mg/dL — ABNORMAL LOW (ref 6–23)
Creatinine, Ser: 0.52 mg/dL (ref 0.50–1.10)
GFR calc Af Amer: >90 mL/min (ref >90)
GFR calc non Af Amer: >90 mL/min (ref >90)
Potassium: 3.3 mEq/L — ABNORMAL LOW (ref 3.5–5.1)

## 2012-02-25 LAB — CBC
MCHC: 29.8 g/dL — ABNORMAL LOW (ref 30.0–36.0)
RDW: 18.3 % — ABNORMAL HIGH (ref 11.5–15.5)
WBC: 7.3 10*3/uL (ref 4.0–10.5)

## 2012-02-25 MED ORDER — POTASSIUM CHLORIDE CRYS ER 20 MEQ PO TBCR
40.0000 meq | EXTENDED_RELEASE_TABLET | ORAL | Status: AC
  Administered 2012-02-25 (×2): 40 meq via ORAL
  Filled 2012-02-25 (×2): qty 2

## 2012-02-25 MED ORDER — ALBUTEROL SULFATE (5 MG/ML) 0.5% IN NEBU
2.5000 mg | INHALATION_SOLUTION | Freq: Three times a day (TID) | RESPIRATORY_TRACT | Status: DC
  Administered 2012-02-25 – 2012-02-26 (×2): 2.5 mg via RESPIRATORY_TRACT
  Filled 2012-02-25 (×2): qty 0.5

## 2012-02-25 NOTE — Progress Notes (Signed)
CRITICAL VALUE ALERT  Critical value received:  Vanc trough 26.6  Date of notification:  02/25/2012   Time of notification:  12:11 PM   Critical value read back:yes  Nurse who received alert:  K. Vear Clock RN   MD notified (1st page):  Benjamine Mola  Time of first page:  12:11 PM   MD notified (2nd page):  Time of second page:  Responding MD:  Benjamine Mola  Time MD responded:  12:11 PM

## 2012-02-25 NOTE — Progress Notes (Signed)
ANTIBIOTIC CONSULT NOTE - follow up  Pharmacy Consult for Vancomycin and may adjust abx for renal fxn. Indication: pneumonia  Allergies  Allergen Reactions  . Augmentin (Amoxicillin-Pot Clavulanate) Rash    Patient Measurements: Height: 5\' 8"  (172.7 cm) Weight: 113 lb 4.8 oz (51.393 kg) IBW/kg (Calculated) : 63.9   Vital Signs: Temp: 98.4 F (36.9 C) (11/14 0538) Temp src: Oral (11/14 0538) BP: 96/58 mmHg (11/14 0538) Pulse Rate: 109  (11/14 0538) Intake/Output from previous day: 11/13 0701 - 11/14 0700 In: 1780 [P.O.:1580; IV Piggyback:200] Out: 1775 [Urine:1300; Emesis/NG output:150; Stool:325] Intake/Output from this shift: Total I/O In: 360 [P.O.:360] Out: -   Labs:  Basename 02/25/12 0512 02/24/12 0502 02/23/12 0331  WBC 7.3 7.0 14.0*  HGB 9.0* 8.2* 7.7*  PLT 643* 562* 509*  LABCREA -- -- --  CREATININE 0.52 0.51 0.51   Estimated Creatinine Clearance: 91 ml/min (by C-G formula based on Cr of 0.52).  Basename 02/25/12 1109 02/23/12 1145  VANCOTROUGH 26.6* 10.5  VANCOPEAK -- --  Drue Dun -- --  GENTTROUGH -- --  GENTPEAK -- --  GENTRANDOM -- --  TOBRATROUGH -- --  TOBRAPEAK -- --  TOBRARND -- --  AMIKACINPEAK -- --  AMIKACINTROU -- --  AMIKACIN -- --     Microbiology: Recent Results (from the past 720 hour(s))  CULTURE, BLOOD (ROUTINE X 2)     Status: Normal (Preliminary result)   Collection Time   02/21/12  1:15 PM      Component Value Range Status Comment   Specimen Description BLOOD LEFT ARM  5 ML IN [facility] BOTTLE   Final    Special Requests NONE   Final    Culture  Setup Time 02/21/2012 19:17   Final    Culture     Final    Value:        BLOOD CULTURE RECEIVED NO GROWTH TO DATE CULTURE WILL BE HELD FOR 5 DAYS BEFORE ISSUING A FINAL NEGATIVE REPORT   Report Status PENDING   Incomplete   CULTURE, BLOOD (ROUTINE X 2)     Status: Normal (Preliminary result)   Collection Time   02/21/12  1:45 PM      Component Value Range Status Comment   Specimen Description BLOOD RIGHT HAND   Final    Special Requests BOTTLES DRAWN AEROBIC ONLY 2.5 CC   Final    Culture  Setup Time 02/21/2012 19:17   Final    Culture     Final    Value:        BLOOD CULTURE RECEIVED NO GROWTH TO DATE CULTURE WILL BE HELD FOR 5 DAYS BEFORE ISSUING A FINAL NEGATIVE REPORT   Report Status PENDING   Incomplete   MRSA PCR SCREENING     Status: Abnormal   Collection Time   02/21/12  4:42 PM      Component Value Range Status Comment   MRSA by PCR POSITIVE (*) NEGATIVE Final   URINE CULTURE     Status: Normal   Collection Time   02/21/12  5:30 PM      Component Value Range Status Comment   Specimen Description URINE, CLEAN CATCH   Final    Special Requests NONE   Final    Culture  Setup Time 02/22/2012 09:12   Final    Colony Count NO GROWTH   Final    Culture NO GROWTH   Final    Report Status 02/23/2012 FINAL   Final   CULTURE, EXPECTORATED SPUTUM-ASSESSMENT  Status: Normal   Collection Time   02/23/12  7:12 PM      Component Value Range Status Comment   Specimen Description SPUTUM   Final    Special Requests NONE   Final    Sputum evaluation     Final    Value: THIS SPECIMEN IS ACCEPTABLE. RESPIRATORY CULTURE REPORT TO FOLLOW.   Report Status 02/23/2012 FINAL   Final   CULTURE, RESPIRATORY     Status: Normal (Preliminary result)   Collection Time   02/23/12  7:12 PM      Component Value Range Status Comment   Specimen Description SPUTUM   Final    Special Requests NONE   Final    Gram Stain     Final    Value: MODERATE WBC PRESENT,BOTH PMN AND MONONUCLEAR     NO SQUAMOUS EPITHELIAL CELLS SEEN     FEW GRAM POSITIVE COCCI IN CLUSTERS     IN PAIRS   Culture Culture reincubated for better growth   Final    Report Status PENDING   Incomplete     Medical History: Past Medical History  Diagnosis Date  . Cough   . GERD (gastroesophageal reflux disease)   . PNA (pneumonia)   . Colitis, acute     Medications:  Anti-infectives     Start      Dose/Rate Route Frequency Ordered Stop   02/25/12 1000   levofloxacin (LEVAQUIN) tablet 500 mg        500 mg Oral Daily 02/24/12 1258     02/23/12 2000   vancomycin (VANCOCIN) IVPB 1000 mg/200 mL premix        1,000 mg 200 mL/hr over 60 Minutes Intravenous Every 8 hours 02/23/12 1315     02/23/12 1200   levofloxacin (LEVAQUIN) tablet 500 mg  Status:  Discontinued        500 mg Oral Daily 02/23/12 1011 02/24/12 1258   02/21/12 2000   vancomycin (VANCOCIN) 750 mg in sodium chloride 0.9 % 150 mL IVPB  Status:  Discontinued        750 mg 150 mL/hr over 60 Minutes Intravenous Every 8 hours 02/21/12 1714 02/23/12 1315   02/21/12 1800   ceFEPIme (MAXIPIME) 1 g in dextrose 5 % 50 mL IVPB  Status:  Discontinued        1 g 100 mL/hr over 30 Minutes Intravenous Every 8 hours 02/21/12 1641 02/22/12 1117   02/21/12 1800   levofloxacin (LEVAQUIN) IVPB 750 mg  Status:  Discontinued        750 mg 100 mL/hr over 90 Minutes Intravenous Every 24 hours 02/21/12 1641 02/23/12 1011   02/21/12 1600   imipenem-cilastatin (PRIMAXIN) 500 mg in sodium chloride 0.9 % 100 mL IVPB  Status:  Discontinued        500 mg 200 mL/hr over 30 Minutes Intravenous 3 times per day 02/21/12 1548 02/21/12 1655   02/21/12 1430   cefTRIAXone (ROCEPHIN) 1 g in dextrose 5 % 50 mL IVPB  Status:  Discontinued        1 g 100 mL/hr over 30 Minutes Intravenous Every 24 hours 02/21/12 1426 02/21/12 1548   02/21/12 1430   azithromycin (ZITHROMAX) 500 mg in dextrose 5 % 250 mL IVPB  Status:  Discontinued        500 mg 250 mL/hr over 60 Minutes Intravenous Every 24 hours 02/21/12 1426 02/21/12 1641         Assessment: 21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 Admitted with SOB and green sputum. Dx broncopneumonia/bronchiectasis.  1/10 >> Azithromycin(MD) >> 11/10 11/10 >> Primaxin >> 11/10 11/10 >> Cefepime >>11/11 11/10 >> Vanco x8d >> 11/10 >> Levaquin >>  Tmax: Afebrile WBCs: wnl Renal: wnl,  CrCl=91 PCT: 0.66 --> 6.48  11/10 blood: NGTD  11/10 Urine: neg MRSA by PCR = + 11/12 sputum: few GPC  Vanc trough = 26.6 drawn at 11:09. Drawn prior to hanging 12:00 dose. 04:00 dose not charted.  Goal of Therapy:  Vancomycin trough level 15-20 mcg/ml  Plan:   Hold Vanc doses for now.  Check random Vanc ~12hrs later to calculate clearance rate to help determine an appropriate regimen.  Charolotte Eke, PharmD, pager [phone]. 02/25/2012,1:08 PM.

## 2012-02-25 NOTE — Progress Notes (Signed)
Patient seen and examined by me.  Plan to d/c once microbiology back (MRSA +/-) and dose abx based on this.    Marlin Canary DO

## 2012-02-25 NOTE — Progress Notes (Signed)
[facility] PROGRESS NOTE  Beth Larsen WUJ:[id number] DOB: 1990-11-12 DOA: 02/21/2012 PCP: Blenda Mounts, MD  Assessment/Plan: Sepsis. secondary to pneumonia/ Bronchioectasis. Resolved. Lactic acid down to 1.0 on 02/22/12. Procalcitonin 0.66. saline locked. Patient continues to feel better. HR better but still tachy to low 100s and RR in 20s. Continue IV vanco. Change levaquin to po. (day#5) Cough better and remains afebrile.Marland Kitchen  Awaiting sputum gm stain.  Acute Respiratory Failure/Recurrent Pneumonia resolved 02/24/12.-no clear cause of recurrent symptoms. Patient seen by pulmonary as outpatient in the past. Alpha 1 antitrypsin negative. No family hx of cystic fibrosis. 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 Appreciate PCCM. Will follow recommendations on sign off note  Hyponatremia  Likely due to sepsis, Dehydration ad PNA. Stable at 134. Will recheck in am.  Dehydration   Po intake improved. Will saline lock.  Hypokalemia  Continues at lower end of norm.  Will replete and recheck in am. mg 1.9  Underweight/protein calorie malnutrition/BMI 17.1  nutrition consult.  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 Needs to follow up as outpt  Thrombocytosis  Likely related to sepsis. Stable. Will follow  Anemia  Drop in H&H noted informs occasional heavy menses. Last hb in the system over 6 mths back of 9.0.. Iron panel shows low iron stores. B12, folate and TSH wnl. Will start on iron supplements. Hg 8.2 today   Code Status: full Family Communication:  Disposition Plan: home with mother hopefully tomorrow   Consultants:  [facility]  Procedures:  none  Antibiotics:  Vanc 02/21/12>>>>>  Levaquin 02/21/12 >>>  HPI/Subjective:  Awake alert . NAD Denies pain/discomfort  Objective: Filed Vitals:   02/24/12 1506 02/24/12  2051 02/24/12 2113 02/25/12 0538  BP: 98/74  108/73 96/58  Pulse: 128 112 133 109  Temp: 98.2 F (36.8 C)  98.2 F (36.8 C) 98.4 F (36.9 C)  TempSrc: Oral  Oral Oral  Resp: 20 20 20 20   Height:      Weight:      SpO2: 96% 98% 100% 96%    Intake/Output Summary (Last 24 hours) at 02/25/12 1241 Last data filed at 02/25/12 0900  Gross per 24 hour  Intake   1820 ml  Output    950 ml  Net    870 ml   Filed Weights   02/21/12 1648 02/23/12 0000 02/23/12 2157  Weight: 51 kg (112 lb 7 oz) 54.4 kg (119 lb 14.9 oz) 51.393 kg (113 lb 4.8 oz)    Exam:   General:  Sitting up in bed. thin NAD  Cardiovascular: tachycardic regular No MGR No LEE  Respiratory: normal effort BS improved but still diminished  Abdomen:  Flat soft +BS non-tender to palpation  Data Reviewed: Basic Metabolic Panel:  Lab 02/25/12 9562 02/24/12 0502 02/23/12 1645 02/23/12 0331 02/22/12 0348 02/21/12 1315  NA 134* 133* -- 133* 131* 124*  K 3.3* 3.4* 2.6* 2.5* 3.8 --  CL 97 95* -- 98 99 85*  CO2 30 28 -- 26 26 21   GLUCOSE 102* 90 -- 112* 101* 128*  BUN 4* <3* -- <3* 4* 11  CREATININE 0.52 0.51 -- 0.51 0.59 0.55  CALCIUM 9.6 9.1 -- 8.4 8.2* 10.3  MG -- -- -- 1.9 -- --  PHOS -- -- -- -- -- --   Liver Function Tests:  Lab 02/22/12 0348 02/21/12 1315  AST 32 27  ALT 16 16  ALKPHOS 92 120*  BILITOT 0.7 0.9  PROT 6.7 10.3*  ALBUMIN 2.2* 3.4*   No results found for this basename: LIPASE:5,AMYLASE:5 in the last 168 hours No results found for this basename: AMMONIA:5 in the last 168 hours CBC:  Lab 02/25/12 0512 02/24/12 0502 02/23/12 0331 02/22/12 0430 02/22/12 0348 02/21/12 1315  WBC 7.3 7.0 14.0* 14.6* 14.3* --  NEUTROABS -- -- 11.7* -- -- 21.1*  HGB 9.0* 8.2* 7.7* 7.5* 7.7* --  HCT 30.2* 28.0* 25.8* 23.9* 25.2* --  MCV 71.6* 71.1* 71.5* 70.3* 70.2* --  PLT 643* 562* 509* 483* 510* --   Cardiac Enzymes: No results found for this basename: CKTOTAL:5,CKMB:5,CKMBINDEX:5,TROPONINI:5 in the last  168 hours BNP (last 3 results) No results found for this basename: PROBNP:3 in the last 8760 hours CBG: No results found for this basename: GLUCAP:5 in the last 168 hours  Recent Results (from the past 240 hour(s))  CULTURE, BLOOD (ROUTINE X 2)     Status: Normal (Preliminary result)   Collection Time   02/21/12  1:15 PM      Component Value Range Status Comment   Specimen Description BLOOD LEFT ARM  5 ML IN [facility] BOTTLE   Final    Special Requests NONE   Final    Culture  Setup Time 02/21/2012 19:17   Final    Culture     Final    Value:        BLOOD CULTURE RECEIVED NO GROWTH TO DATE CULTURE WILL BE HELD FOR 5 DAYS BEFORE ISSUING A FINAL NEGATIVE REPORT   Report Status PENDING   Incomplete   CULTURE, BLOOD (ROUTINE X 2)     Status: Normal (Preliminary result)   Collection Time   02/21/12  1:45 PM      Component Value Range Status Comment   Specimen Description BLOOD RIGHT HAND   Final    Special Requests BOTTLES DRAWN AEROBIC ONLY 2.5 CC   Final    Culture  Setup Time 02/21/2012 19:17   Final    Culture     Final    Value:        BLOOD CULTURE RECEIVED NO GROWTH TO DATE CULTURE WILL BE HELD FOR 5 DAYS BEFORE ISSUING A FINAL NEGATIVE REPORT   Report Status PENDING   Incomplete   MRSA PCR SCREENING     Status: Abnormal   Collection Time   02/21/12  4:42 PM      Component Value Range Status Comment   MRSA by PCR POSITIVE (*) NEGATIVE Final   URINE CULTURE     Status: Normal   Collection Time   02/21/12  5:30 PM      Component Value Range Status Comment   Specimen Description URINE, CLEAN CATCH   Final    Special Requests NONE   Final    Culture  Setup Time 02/22/2012 09:12   Final    Colony Count NO GROWTH   Final    Culture NO GROWTH   Final    Report Status 02/23/2012 FINAL   Final   CULTURE, EXPECTORATED SPUTUM-ASSESSMENT     Status: Normal   Collection Time   02/23/12  7:12 PM      Component Value Range Status Comment   Specimen Description SPUTUM   Final    Special  Requests NONE   Final    Sputum evaluation     Final    Value: THIS SPECIMEN IS  ACCEPTABLE. RESPIRATORY CULTURE REPORT TO FOLLOW.   Report Status 02/23/2012 FINAL   Final   CULTURE, RESPIRATORY     Status: Normal (Preliminary result)   Collection Time   02/23/12  7:12 PM      Component Value Range Status Comment   Specimen Description SPUTUM   Final    Special Requests NONE   Final    Gram Stain     Final    Value: MODERATE WBC PRESENT,BOTH PMN AND MONONUCLEAR     NO SQUAMOUS EPITHELIAL CELLS SEEN     FEW GRAM POSITIVE COCCI IN CLUSTERS     IN PAIRS   Culture Culture reincubated for better growth   Final    Report Status PENDING   Incomplete      Studies: Dg Chest Port 1 View  02/24/2012  *RADIOLOGY REPORT*  Clinical Data: Pneumonia.  PORTABLE CHEST - 1 VIEW  Comparison: 02/22/2012  Findings: Patchy bilateral airspace disease and nodular airspace disease again noted, unchanged.  Small bilateral effusions and cardiomegaly.  No acute bony abnormality.  IMPRESSION: No significant change.   Original Report Authenticated By: Charlett Nose, M.D.     Scheduled Meds:   . albuterol  2.5 mg Nebulization Q8H  . antiseptic oral rinse  15 mL Mouth Rinse BID  . Chlorhexidine Gluconate Cloth  6 each Topical Q0600  . ferrous sulfate  325 mg Oral Q breakfast  . levofloxacin  500 mg Oral Daily  . mupirocin ointment  1 application Nasal BID  . potassium chloride  40 mEq Oral Once  . [COMPLETED] potassium chloride  40 mEq Oral Once  . [COMPLETED] potassium chloride  40 mEq Oral Q4H  . vancomycin  1,000 mg Intravenous Q8H  . [DISCONTINUED] levofloxacin  500 mg Oral Daily   Continuous Infusions:   . [DISCONTINUED] sodium chloride 10 mL/hr (02/22/12 1125)    Principal Problem:  *Acute respiratory failure Active Problems:  Bronchiectasis  Recurrent pneumonia  Tachycardia  Hyponatremia  Anemia  Hypokalemia  Iron deficiency anemia    Time spent: 30 minutes    [facility] M NP [facility]  If 8PM-8AM, please contact night-coverage at [email or web address], password [facility] 02/25/2012, 12:41 PM  LOS: 4 days

## 2012-02-26 LAB — CBC
Hemoglobin: 9.4 g/dL — ABNORMAL LOW (ref 12.0–15.0)
MCHC: 29.7 g/dL — ABNORMAL LOW (ref 30.0–36.0)
Platelets: 645 10*3/uL — ABNORMAL HIGH (ref 150–400)
RDW: 18.3 % — ABNORMAL HIGH (ref 11.5–15.5)

## 2012-02-26 LAB — CULTURE, RESPIRATORY W GRAM STAIN

## 2012-02-26 LAB — BASIC METABOLIC PANEL
BUN: 7 mg/dL (ref 6–23)
Calcium: 10.2 mg/dL (ref 8.4–10.5)
GFR calc Af Amer: >90 mL/min (ref >90)
GFR calc non Af Amer: >90 mL/min (ref >90)
Potassium: 4.1 mEq/L (ref 3.5–5.1)

## 2012-02-26 LAB — CYSTIC FIBROSIS DIAGNOSTIC STUDY

## 2012-02-26 LAB — TYPE AND SCREEN
ABO/RH(D): O POS
Antibody Screen: NEGATIVE
Unit division: 0

## 2012-02-26 LAB — VANCOMYCIN, RANDOM: Vancomycin Rm: <5 ug/mL

## 2012-02-26 MED ORDER — LEVOFLOXACIN 500 MG PO TABS: 500.0000 mg | ORAL_TABLET | Freq: Every day | ORAL | Status: AC

## 2012-02-26 MED ORDER — ALBUTEROL SULFATE (5 MG/ML) 0.5% IN NEBU: 2.5000 mg | INHALATION_SOLUTION | Freq: Three times a day (TID) | RESPIRATORY_TRACT | Status: DC

## 2012-02-26 MED ORDER — VANCOMYCIN HCL 1000 MG IV SOLR
750.0000 mg | Freq: Three times a day (TID) | INTRAVENOUS | Status: DC
  Administered 2012-02-26: 750 mg via INTRAVENOUS
  Filled 2012-02-26 (×2): qty 750

## 2012-02-26 MED ORDER — FERROUS SULFATE 325 (65 FE) MG PO TABS: 325.0000 mg | ORAL_TABLET | Freq: Every day | ORAL | Status: DC

## 2012-02-26 NOTE — Discharge Summary (Signed)
Physician Discharge Summary  [name] ZOX:[id number] DOB: 1990-11-03 DOA: 02/21/2012  PCP: Blenda Mounts, MD  Admit date: 02/21/2012 Discharge date: 02/26/2012  Time spent: 40 minutes minutes  Recommendations for Outpatient Follow-up:  1. Will be discharge to home . Will continue with nebs and chest percussion. Has appointment with pulm 03/09/12. Will complete 3 weeks antibiotics  Discharge Diagnoses:  Principal Problem:  *Acute respiratory failure Active Problems:  Bronchiectasis  Recurrent pneumonia  Tachycardia  Hyponatremia  Anemia  Hypokalemia  Iron deficiency anemia   Discharge Condition: medically stable and ready for discharge to home  Diet recommendation: needs to increase weight by 15 lbs. Target weight 125  Filed Weights   02/21/12 1648 02/23/12 0000 02/23/12 2157  Weight: 51 kg (112 lb 7 oz) 54.4 kg (119 lb 14.9 oz) 51.393 kg (113 lb 4.8 oz)    History of present illness:  Beth Larsen is a 21 y.o. 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 Patient noteed that she  had persistent cough for last few years which improved with antibiotics but after antibiotics were discontinued she would again then develop cough at baseline. Her most recent symptoms started 2 days ago with cough productive for green sputum. She also has had sinus and nasal congestion. She last completed course of antibiotics in August of 2013. Has felt feverish at home. Denied any chest pain. Denied any abdominal pain or diarrhea. Denied any headaches or vision changes. In the emergency department patient was found to be tachycardic with HR of 160s, she is also tachypneic with respiratory rate in the 30s to 40s with hypoxia, chest x-ray shows patchy pulmonary infiltrates bilaterally. The hospitalist service was asked to admit the patient for management of  pneumonia.   Hospital Course:  Sepsis. secondary to pneumonia/ Bronchioectasis.admitted to SD. PCCM consulted. Pt responded to IV fluids, antibiotics  Resolved. Lactic acid down to 1.0 on 02/22/12. Procalcitonin 0.66. saline locked. Patient continues to feel better. HR better but still tachy to low 100s and RR in 20s. IV vanco for 5 days. Levaquin to be continued for total 3 weeks. Cough better and remains afebrile.  Acute Respiratory Failure/Recurrent Pneumonia resolved 02/24/12.-no clear cause of recurrent symptoms. Patient seen by pulmonary as outpatient in the past. Alpha 1 antitrypsin negative. No family hx of cystic fibrosis. 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 Seen by pulmonology. Chest percussion and nebs recommended. Will continue at discharge. Has follow up appointment 03/09/12/  Hyponatremia  Likely due to sepsis, Dehydration ad PNA. Resolved. At discharge sodium 135   Hypokalemia   Related to #1.  Repleted x3. Mg level 1.9. Resolved at discharge. Potassium 4.1  Underweight/protein calorie malnutrition/BMI 17.1  nutrition consult. 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 Needs to follow up as outpt   Thrombocytosis  Likely related to sepsis. Stable during hospitalization. Will need OP follow up. No bleeding.   Anemia  Drop in H&H noted informs occasional heavy menses. Last hb in the system over 6 mths back of 9.0.. Iron panel shows low iron stores. B12, folate and TSH wnl. Pt  started on iron supplements. Hg 9.2 at discharge      Procedures:  none  Consultations:  PCCM  Discharge Exam: Filed Vitals:   02/25/12 1943 02/25/12 2135 02/26/12 0607 02/26/12 0807  BP:  110/67 100/56   Pulse:  55 96   Temp:  98.5 F (36.9 C) 98.3 F (36.8 C)   TempSrc:  Oral Oral   Resp:  18 20     Height:      Weight:      SpO2: 100% 97% 99% 99%    General: awake alert ambulating in hall NAD Cardiovascular: RRR No MGR No LEE Respiratory: normal effort. BS distant but clear.   Discharge Instructions  Discharge Orders    Future Appointments: Provider: Department: Dept Phone: Center:   03/09/2012 10:30 AM Nyoka Cowden, MD [facility] [phone] None   03/31/2012 11:30 AM Michele Mcalpine, MD [facility] [phone] None     Future Orders Please Complete By Expires   Diet - low sodium heart healthy      Increase activity slowly      Discharge instructions      Comments:   Complete antibiotics as prescribed Chest percussion 10-15 min BID Nebulizer BID (may do at same time) Recommend 15lb weight gain (target wt 125)   Call MD for:  temperature >100.4      Call MD for:  persistant nausea and vomiting      Call MD for:  difficulty breathing, headache or visual disturbances      Call MD for:  persistant dizziness or light-headedness          Medication List     As of 02/26/2012 12:37 PM    TAKE these medications         albuterol (5 MG/ML) 0.5% nebulizer solution   Commonly known as: PROVENTIL   Take 0.5 mLs (2.5 mg total) by nebulization 3 (three) times daily.      cetirizine 10 MG tablet   Commonly known as: ZYRTEC   Take 10 mg by mouth daily.      ferrous sulfate 325 (65 FE) MG tablet   Take 1 tablet (325 mg total) by mouth daily with breakfast.      levofloxacin 500 MG tablet   Commonly known as: LEVAQUIN   Take 1 tablet (500 mg total) by mouth daily.      PROZAC 10 MG capsule   Generic drug: FLUoxetine   Take 30 mg by mouth daily.           Follow-up Information    Follow up with Sandrea Hughs, MD. On 03/09/2012. (has appointment 03/09/12. will need chest xray and ROV)    Contact information:   [address] [place] [address] [phone]           The results of significant diagnostics  from this hospitalization (including imaging, microbiology, ancillary and laboratory) are listed below for reference.    Significant Diagnostic Studies: Dg Chest 2 View  02/21/2012  *RADIOLOGY REPORT*  Clinical Data: Productive cough.  History of pneumonia.  CHEST - 2 VIEW  Comparison: None.  Findings: Heart size is normal.  There are extensive patchy infiltrates throughout both lungs, lower lobe predominant.  This most likely represents widespread bronchopneumonia.  No effusions. No significant bony finding.  The patchy/nodular pattern does raise the possibility of other entities such as sarcoid, masses and Wegener's granulomatosis. Those diagnoses are certainly less likely than ordinary bronchopneumonia.  IMPRESSION: Patchy pulmonary infiltrates bilaterally, lower lobe predominant, most consistent with bronchopneumonia.  See  above.   Original Report Authenticated By: Paulina Fusi, M.D.    Ct Chest W Contrast  02/22/2012  *RADIOLOGY REPORT*  Clinical Data: Evaluate infiltrates versus bronchiectasis.  CT CHEST WITH CONTRAST  Technique:  Multidetector CT imaging of the chest was performed following the standard protocol during bolus administration of intravenous contrast.  Contrast: 60mL OMNIPAQUE IOHEXOL 300 MG/ML  SOLN  Comparison: Chest radiograph 02/22/2012.  Findings: Mediastinal lymph nodes measure up to 12 mm in the right paratracheal station.  Bihilar lymphoid tissue.  No axillary adenopathy.  Heart is mildly enlarged.  No pericardial effusion.  There are cystic/cavitary lesions in the lungs bilaterally, many of which are thick-walled and contain internal fluid.  Some areas of nodular airspace consolidation are seen as well.  Airspace consolidation is most severe in the right lower lobe with a tiny right pleural effusion.  Fluid-filled cystic bronchiectasis in the left lower lobe.  Bibasilar predominant diffuse peribronchovascular nodularity.  Possible trace left pleural fluid.  Airway is otherwise  unremarkable.  Incidental imaging of the upper abdomen shows no acute findings. No worrisome lytic or sclerotic lesions.  IMPRESSION:  1.  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  Allergic bronchopulmonary aspergillosis (ABPA) is another consideration.  Cystic fibrosis is considered less likely given the patient's age. 2.  Probable reactive mediastinal adenopathy. 3.  Tiny bilateral pleural effusions.   Original Report Authenticated By: Leanna Battles, M.D.    Dg Chest Port 1 View  02/24/2012  *RADIOLOGY REPORT*  Clinical Data: Pneumonia.  PORTABLE CHEST - 1 VIEW  Comparison: 02/22/2012  Findings: Patchy bilateral airspace disease and nodular airspace disease again noted, unchanged.  Small bilateral effusions and cardiomegaly.  No acute bony abnormality.  IMPRESSION: No significant change.   Original Report Authenticated By: Charlett Nose, M.D.    Dg Chest Port 1 View  02/22/2012  *RADIOLOGY REPORT*  Clinical Data: Shortness of breath, pneumonia  PORTABLE CHEST - 1 VIEW  Comparison: 02/21/2012  Findings: Cardiac shadow is stable.  Patchy somewhat nodular appearing infiltrate is again identified bilaterally.  The small effusions are now seen bilaterally.  IMPRESSION: New small effusions bilaterally.  Patchy bilateral infiltrate is again noted.   Original Report Authenticated By: Alcide Clever, M.D.     Microbiology: Recent Results (from the past 240 hour(s))  CULTURE, BLOOD (ROUTINE X 2)     Status: Normal (Preliminary result)   Collection Time   02/21/12  1:15 PM      Component Value Range Status Comment   Specimen Description BLOOD LEFT ARM  5 ML IN [facility] BOTTLE   Final    Special Requests NONE   Final    Culture  Setup Time 02/21/2012 19:17   Final     Culture     Final    Value:        BLOOD CULTURE RECEIVED NO GROWTH TO DATE CULTURE WILL BE HELD FOR 5 DAYS BEFORE ISSUING A FINAL NEGATIVE REPORT   Report Status PENDING   Incomplete   CULTURE, BLOOD (ROUTINE X 2)     Status: Normal (Preliminary result)   Collection Time   02/21/12  1:45 PM      Component Value Range Status Comment   Specimen Description BLOOD RIGHT HAND   Final    Special Requests BOTTLES DRAWN AEROBIC ONLY 2.5 CC  Final    Culture  Setup Time 02/21/2012 19:17   Final    Culture     Final    Value:        BLOOD CULTURE RECEIVED NO GROWTH TO DATE CULTURE WILL BE HELD FOR 5 DAYS BEFORE ISSUING A FINAL NEGATIVE REPORT   Report Status PENDING   Incomplete   MRSA PCR SCREENING     Status: Abnormal   Collection Time   02/21/12  4:42 PM      Component Value Range Status Comment   MRSA by PCR POSITIVE (*) NEGATIVE Final   URINE CULTURE     Status: Normal   Collection Time   02/21/12  5:30 PM      Component Value Range Status Comment   Specimen Description URINE, CLEAN CATCH   Final    Special Requests NONE   Final    Culture  Setup Time 02/22/2012 09:12   Final    Colony Count NO GROWTH   Final    Culture NO GROWTH   Final    Report Status 02/23/2012 FINAL   Final   CULTURE, EXPECTORATED SPUTUM-ASSESSMENT     Status: Normal   Collection Time   02/23/12  7:12 PM      Component Value Range Status Comment   Specimen Description SPUTUM   Final    Special Requests NONE   Final    Sputum evaluation     Final    Value: THIS SPECIMEN IS ACCEPTABLE. RESPIRATORY CULTURE REPORT TO FOLLOW.   Report Status 02/23/2012 FINAL   Final   CULTURE, RESPIRATORY     Status: Normal   Collection Time   02/23/12  7:12 PM      Component Value Range Status Comment   Specimen Description SPUTUM   Final    Special Requests NONE   Final    Gram Stain     Final    Value: MODERATE WBC PRESENT,BOTH PMN AND MONONUCLEAR     NO SQUAMOUS EPITHELIAL CELLS SEEN     FEW GRAM POSITIVE COCCI IN  CLUSTERS     IN PAIRS   Culture FEW STREPTOCOCCUS,BETA HEMOLYIC NOT GROUP A   Final    Report Status 02/26/2012 FINAL   Final      Labs: Basic Metabolic Panel:  Lab 02/26/12 8657 02/25/12 0512 02/24/12 0502 02/23/12 1645 02/23/12 0331 02/22/12 0348  NA 135 134* 133* -- 133* 131*  K 4.1 3.3* 3.4* 2.6* 2.5* --  CL 97 97 95* -- 98 99  CO2 28 30 28  -- 26 26  GLUCOSE 98 102* 90 -- 112* 101*  BUN 7 4* <3* -- <3* 4*  CREATININE 0.56 0.52 0.51 -- 0.51 0.59  CALCIUM 10.2 9.6 9.1 -- 8.4 8.2*  MG -- -- -- -- 1.9 --  PHOS -- -- -- -- -- --   Liver Function Tests:  Lab 02/22/12 0348 02/21/12 1315  AST 32 27  ALT 16 16  ALKPHOS 92 120*  BILITOT 0.7 0.9  PROT 6.7 10.3*  ALBUMIN 2.2* 3.4*   No results found for this basename: LIPASE:5,AMYLASE:5 in the last 168 hours No results found for this basename: AMMONIA:5 in the last 168 hours CBC:  Lab 02/26/12 0454 02/25/12 0512 02/24/12 0502 02/23/12 0331 02/22/12 0430 02/21/12 1315  WBC 8.7 7.3 7.0 14.0* 14.6* --  NEUTROABS -- -- -- 11.7* -- 21.1*  HGB 9.4* 9.0* 8.2* 7.7* 7.5* --  HCT 31.7* 30.2* 28.0* 25.8* 23.9* --  MCV 72.2*  71.6* 71.1* 71.5* 70.3* --  PLT 645* 643* 562* 509* 483* --   Cardiac Enzymes: No results found for this basename: CKTOTAL:5,CKMB:5,CKMBINDEX:5,TROPONINI:5 in the last 168 hours BNP: BNP (last 3 results) No results found for this basename: PROBNP:3 in the last 8760 hours CBG: No results found for this basename: GLUCAP:5 in the last 168 hours     Signed:  Gwenyth Bender NP [facility] 02/26/2012, 12:37 PM

## 2012-02-26 NOTE — Progress Notes (Addendum)
ANTIBIOTIC CONSULT NOTE - follow up  Pharmacy Consult for Vancomycin and may adjust abx for renal fxn. Indication: pneumonia  Allergies  Allergen Reactions  . Augmentin (Amoxicillin-Pot Clavulanate) Rash    Patient Measurements: Height: 5\' 8"  (172.7 cm) Weight: 113 lb 4.8 oz (51.393 kg) IBW/kg (Calculated) : 63.9   Vital Signs: Temp: 98.3 F (36.8 C) (11/15 0607) Temp src: Oral (11/15 0607) BP: 100/56 mmHg (11/15 0607) Pulse Rate: 96  (11/15 0607) Intake/Output from previous day: 11/14 0701 - 11/15 0700 In: 840 [P.O.:840] Out: 200 [Stool:200] Intake/Output from this shift: Total I/O In: 240 [P.O.:240] Out: 200 [Stool:200]  Labs:  [facility] 02/26/12 0454 02/25/12 0512 02/24/12 0502  WBC 8.7 7.3 7.0  HGB 9.4* 9.0* 8.2*  PLT 645* 643* 562*  LABCREA -- -- --  CREATININE 0.56 0.52 0.51   Estimated Creatinine Clearance: 91 ml/min (by C-G formula based on Cr of 0.56).  Basename 02/26/12 0454 02/25/12 1109 02/23/12 1145  VANCOTROUGH -- 26.6* 10.5  VANCOPEAK -- -- --  Drue Dun <5.0 -- --  GENTTROUGH -- -- --  GENTPEAK -- -- --  GENTRANDOM -- -- --  TOBRATROUGH -- -- --  TOBRAPEAK -- -- --  TOBRARND -- -- --  AMIKACINPEAK -- -- --  AMIKACINTROU -- -- --  AMIKACIN -- -- --     Microbiology: Recent Results (from the past 720 hour(s))  CULTURE, BLOOD (ROUTINE X 2)     Status: Normal (Preliminary result)   Collection Time   02/21/12  1:15 PM      Component Value Range Status Comment   Specimen Description BLOOD LEFT ARM  5 ML IN [facility] BOTTLE   Final    Special Requests NONE   Final    Culture  Setup Time 02/21/2012 19:17   Final    Culture     Final    Value:        BLOOD CULTURE RECEIVED NO GROWTH TO DATE CULTURE WILL BE HELD FOR 5 DAYS BEFORE ISSUING A FINAL NEGATIVE REPORT   Report Status PENDING   Incomplete   CULTURE, BLOOD (ROUTINE X 2)     Status: Normal (Preliminary result)   Collection Time   02/21/12  1:45 PM      Component Value Range Status  Comment   Specimen Description BLOOD RIGHT HAND   Final    Special Requests BOTTLES DRAWN AEROBIC ONLY 2.5 CC   Final    Culture  Setup Time 02/21/2012 19:17   Final    Culture     Final    Value:        BLOOD CULTURE RECEIVED NO GROWTH TO DATE CULTURE WILL BE HELD FOR 5 DAYS BEFORE ISSUING A FINAL NEGATIVE REPORT   Report Status PENDING   Incomplete   MRSA PCR SCREENING     Status: Abnormal   Collection Time   02/21/12  4:42 PM      Component Value Range Status Comment   MRSA by PCR POSITIVE (*) NEGATIVE Final   URINE CULTURE     Status: Normal   Collection Time   02/21/12  5:30 PM      Component Value Range Status Comment   Specimen Description URINE, CLEAN CATCH   Final    Special Requests NONE   Final    Culture  Setup Time 02/22/2012 09:12   Final    Colony Count NO GROWTH   Final    Culture NO GROWTH   Final    Report Status  02/23/2012 FINAL   Final   CULTURE, EXPECTORATED SPUTUM-ASSESSMENT     Status: Normal   Collection Time   02/23/12  7:12 PM      Component Value Range Status Comment   Specimen Description SPUTUM   Final    Special Requests NONE   Final    Sputum evaluation     Final    Value: THIS SPECIMEN IS ACCEPTABLE. RESPIRATORY CULTURE REPORT TO FOLLOW.   Report Status 02/23/2012 FINAL   Final   CULTURE, RESPIRATORY     Status: Normal (Preliminary result)   Collection Time   02/23/12  7:12 PM      Component Value Range Status Comment   Specimen Description SPUTUM   Final    Special Requests NONE   Final    Gram Stain     Final    Value: MODERATE WBC PRESENT,BOTH PMN AND MONONUCLEAR     NO SQUAMOUS EPITHELIAL CELLS SEEN     FEW GRAM POSITIVE COCCI IN CLUSTERS     IN PAIRS   Culture Culture reincubated for better growth   Final    Report Status PENDING   Incomplete     Medical History: Past Medical History  Diagnosis Date  . Cough   . GERD (gastroesophageal reflux disease)   . PNA (pneumonia)   . Colitis, acute     Medications:  Anti-infectives       Start     Dose/Rate Route Frequency Ordered Stop   02/25/12 1000   levofloxacin (LEVAQUIN) tablet 500 mg        500 mg Oral Daily 02/24/12 1258     02/23/12 2000   vancomycin (VANCOCIN) IVPB 1000 mg/200 mL premix  Status:  Discontinued        1,000 mg 200 mL/hr over 60 Minutes Intravenous Every 8 hours 02/23/12 1315 02/25/12 1310   02/23/12 1200   levofloxacin (LEVAQUIN) tablet 500 mg  Status:  Discontinued        500 mg Oral Daily 02/23/12 1011 02/24/12 1258   02/21/12 2000   vancomycin (VANCOCIN) 750 mg in sodium chloride 0.9 % 150 mL IVPB  Status:  Discontinued        750 mg 150 mL/hr over 60 Minutes Intravenous Every 8 hours 02/21/12 1714 02/23/12 1315   02/21/12 1800   ceFEPIme (MAXIPIME) 1 g in dextrose 5 % 50 mL IVPB  Status:  Discontinued        1 g 100 mL/hr over 30 Minutes Intravenous Every 8 hours 02/21/12 1641 02/22/12 1117   02/21/12 1800   levofloxacin (LEVAQUIN) IVPB 750 mg  Status:  Discontinued        750 mg 100 mL/hr over 90 Minutes Intravenous Every 24 hours 02/21/12 1641 02/23/12 1011   02/21/12 1600   imipenem-cilastatin (PRIMAXIN) 500 mg in sodium chloride 0.9 % 100 mL IVPB  Status:  Discontinued        500 mg 200 mL/hr over 30 Minutes Intravenous 3 times per day 02/21/12 1548 02/21/12 1655   02/21/12 1430   cefTRIAXone (ROCEPHIN) 1 g in dextrose 5 % 50 mL IVPB  Status:  Discontinued        1 g 100 mL/hr over 30 Minutes Intravenous Every 24 hours 02/21/12 1426 02/21/12 1548   02/21/12 1430   azithromycin (ZITHROMAX) 500 mg in dextrose 5 % 250 mL IVPB  Status:  Discontinued        500 mg 250 mL/hr over 60 Minutes Intravenous Every 24 hours  02/21/12 1426 02/21/12 1641         Assessment: 21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 Admitted with SOB and green sputum. Dx broncopneumonia/bronchiectasis.  1/10 >> Azithromycin(MD) >> 11/10 11/10 >> Primaxin >> 11/10 11/10 >> Cefepime >>11/11 11/10 >> Vanco x8d >> 11/10 >> Levaquin  >>  Tmax: Afebrile WBCs: wnl Renal: wnl, CrCl=91 PCT: 0.66 --> 6.48  11/10 blood: NGTD  11/10 Urine: neg MRSA by PCR = + 11/12 sputum: few GPC  Vanc level <5, drawn 18hrs after trough of 26.6. This represents a half-life about 8hrs or less. Not sure why the trough was so high yesterday.  Goal of Therapy:  Vancomycin trough level 15-20 mcg/ml  Plan:   Resume Vanc at 750mg  IV q8h.  Continue Levaquin 500mg  PO daily.   F/u final report on sputum cx and abx plan.   Recheck Vanc trough if remains on Vanc.  Charolotte Eke, PharmD, pager [phone]. 02/26/2012,6:45 AM.

## 2012-02-27 LAB — CULTURE, BLOOD (ROUTINE X 2): Culture: NO GROWTH

## 2012-02-27 NOTE — Discharge Summary (Signed)
Patient seen and examined by me.  Improved but will need follow up for test results.  Pulm appt given to patient and prescriptions called in.  Marlin Canary DO

## 2012-03-09 ENCOUNTER — Encounter: Admitting: Internal Medicine

## 2012-03-09 NOTE — Progress Notes (Signed)
 This encounter was created in error - please disregard.

## 2012-03-23 ENCOUNTER — Ambulatory Visit (INDEPENDENT_AMBULATORY_CARE_PROVIDER_SITE_OTHER): Admitting: Internal Medicine

## 2012-03-23 ENCOUNTER — Ambulatory Visit (INDEPENDENT_AMBULATORY_CARE_PROVIDER_SITE_OTHER)
Admission: RE | Admit: 2012-03-23 | Discharge: 2012-03-23 | Disposition: A | Discharge status: Home / Self Care | Source: Ambulatory Visit | Attending: Internal Medicine | Admitting: Internal Medicine

## 2012-03-23 ENCOUNTER — Encounter: Payer: Self-pay | Admitting: Internal Medicine

## 2012-03-23 VITALS — BP 110/76 | HR 81 | Temp 98.2°F | Ht 68.0 in | Wt 120.0 lb

## 2012-03-23 DIAGNOSIS — J189 Pneumonia, unspecified organism: Secondary | ICD-10-CM

## 2012-03-23 DIAGNOSIS — J479 Bronchiectasis, uncomplicated: Secondary | ICD-10-CM

## 2012-03-23 NOTE — Patient Instructions (Addendum)
For cough use mucinex or mucinex dm  Ok to use nebulizer for now twice daily as needed for bad cough  Use zyrtec 10 mg at bedtime for itching or sneezing or runny nose   Please schedule a follow up office visit in 4 weeks, sooner if needed for pft's

## 2012-03-23 NOTE — Progress Notes (Signed)
Quick Note:  Spoke with pt and notified of results per Dr. Wert. Pt verbalized understanding and denied any questions.  ______ 

## 2012-03-23 NOTE — Progress Notes (Signed)
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  HPI 07/31/2011  1st pulmonary eval cc never completely better since onset of cough while at [employer] around 2011 saw doctor/ never specialist >   rx with a "bunch of things for allergy" nothing completely cleared then w/in a few weeks worse again then saw Reola Calkins in Februrary 2013 with persistent cough > green mucus assoc with nasal congestion rx inhaler and abx  with best rx from abx which turned the mucus back to clear but continues to cough intermittently day and night with thick mucus production and moderate nasal congestion s typical seasonal or allergic features. No problem swallowing. No fm hx lung dz/ CF/  No problems as child with pna. rec Bronchiectasis pathophys reviewed Whenever you develop cough congestion take mucinex or mucinex dm > these will help keep the mucus loose and flowing but if your condition worsens you need to seek help immediately preferably here or somewhere inside the [facility] system to compare xrays ( worse = darker or bloody mucus or pain on breathing in)   Please remember to go to the lab department downstairs for your tests  Augmentin 875 one twice daily bfast and supper with a large glass of water and eat culture yogurt for lunch  08/14/2011 f/u ov/Beth Larsen > no show  08/26/2011 f/u ov/Beth Larsen cc missed appt    Admit date: 02/21/2012  Discharge date: 02/26/2012    Recommendations for Outpatient Follow-up:  1. Will be discharge to home . Will continue with nebs and chest percussion. Has appointment with pulm 03/09/12. Will complete 3 weeks antibiotics Discharge Diagnoses:  Principal Problem:  *Acute respiratory failure  Active Problems:  Bronchiectasis  Recurrent pneumonia  Tachycardia   Hyponatremia  Anemia  Hypokalemia  Iron deficiency anemia    03/09/2012 f/u ov/Beth Larsen cc back to baseline x staying at home. Walking fine and cough is better than it was before the pneumonia.   Sleeping ok without nocturnal  or early am exacerbation  of respiratory  c/o's or need for noct saba. Also denies any obvious fluctuation of symptoms with weather or environmental changes or other aggravating or alleviating factors except as outlined above   ROS  The following are not active complaints unless bolded sore throat, dysphagia, dental problems, itching, sneezing,  nasal congestion or excess/ purulent secretions, ear ache,   fever, chills, sweats, unintended wt loss, pleuritic or exertional cp, hemoptysis,  orthopnea pnd or leg swelling, presyncope, palpitations, heartburn, abdominal pain, anorexia, nausea, vomiting, diarrhea  or change in bowel or urinary habits, change in stools or urine, dysuria,hematuria,  rash, arthralgias, visual complaints, headache, numbness weakness or ataxia or problems with walking or coordination,  change in mood/affect or memory.                Objective:   Physical Exam  Pleasant reserved amb wf nad  Wt 127  07/31/2011 >   03/23/2012  120   HEENT: nl dentition, turbinates, and orophanx. Nl external ear canals without cough reflex   NECK :  without JVD/Nodes/TM/ nl carotid upstrokes bilaterally   LUNGS: no acc muscle use, insp and exp rhonchi but no true wheeze   CV:  RRR  no s3 or murmur or increase in  P2, no edema   ABD:  soft and nontender with nl excursion in the supine position. No bruits or organomegaly, bowel sounds nl  MS:  warm without deformities, calf tenderness, cyanosis - MOD CLUBBING bilaterally  SKIN: warm and dry without lesions    NEURO:  alert, approp, no deficits    CXR  03/23/2012 :  Improving bilateral lower lobe airspace opacities with minimal residual linear densities, likely scarring or atelectasis.         Assessment & Plan:

## 2012-03-24 ENCOUNTER — Encounter: Payer: Self-pay | Admitting: Internal Medicine

## 2012-03-24 NOTE — Assessment & Plan Note (Signed)
-   Quantitative Immunoglobulin profile 07/31/2011 >    - Alpha one AT 07/31/2011 >  MS with AAT level 65.9 mM (nl)    - Allergy profile 07/31/2011 >>>  W/u not yet completed as never returned from f/u. Needs sweat chloride, ct of chest and sinus, immunoglobulin profile, and pft's but for now doing much better so wait until after the holidays to regroup.

## 2012-03-31 ENCOUNTER — Ambulatory Visit: Admitting: Pulmonary Disease

## 2012-04-20 ENCOUNTER — Ambulatory Visit: Admitting: Internal Medicine

## 2012-04-22 ENCOUNTER — Ambulatory Visit: Admitting: Internal Medicine

## 2012-04-22 ENCOUNTER — Encounter

## 2012-05-25 ENCOUNTER — Ambulatory Visit (INDEPENDENT_AMBULATORY_CARE_PROVIDER_SITE_OTHER): Admitting: Internal Medicine

## 2012-05-25 ENCOUNTER — Encounter: Payer: Self-pay | Admitting: Internal Medicine

## 2012-05-25 ENCOUNTER — Ambulatory Visit (INDEPENDENT_AMBULATORY_CARE_PROVIDER_SITE_OTHER)
Admission: RE | Admit: 2012-05-25 | Discharge: 2012-05-25 | Disposition: A | Discharge status: Home / Self Care | Source: Ambulatory Visit | Attending: Internal Medicine | Admitting: Internal Medicine

## 2012-05-25 VITALS — BP 110/62 | HR 82 | Temp 97.8°F | Ht 68.0 in | Wt 124.0 lb

## 2012-05-25 DIAGNOSIS — J479 Bronchiectasis, uncomplicated: Secondary | ICD-10-CM

## 2012-05-25 LAB — PULMONARY FUNCTION TEST

## 2012-05-25 NOTE — Progress Notes (Signed)
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  HPI 07/31/2011  1st pulmonary eval cc never completely better since onset of cough while at [place] around 2011 saw doctor/ never specialist >   rx with a "bunch of things for allergy" nothing completely cleared then w/in a few weeks worse again then saw Reola Calkins in Februrary 2013 with persistent cough > green mucus assoc with nasal congestion rx inhaler and abx  with best rx from abx which turned the mucus back to clear but continues to cough intermittently day and night with thick mucus production and moderate nasal congestion s typical seasonal or allergic features. No problem swallowing. No fm hx lung dz/ CF/  No problems as child with pna. rec Bronchiectasis pathophys reviewed Whenever you develop cough congestion take mucinex or mucinex dm > these will help keep the mucus loose and flowing but if your condition worsens you need to seek help immediately preferably here or somewhere inside the [facility] system to compare xrays ( worse = darker or bloody mucus or pain on breathing in)   Please remember to go to the lab department downstairs for your tests  Augmentin 875 one twice daily bfast and supper with a large glass of water and eat culture yogurt for lunch  08/14/2011 f/u ov/Beth Larsen > no show  08/26/2011 f/u ov/Beth Larsen cc missed appt    Admit date: 02/21/2012  Discharge date: 02/26/2012    Recommendations for Outpatient Follow-up:  1. Will be discharge to home . Will continue with nebs and chest percussion. Has appointment with pulm 03/09/12. Will complete 3 weeks antibiotics Discharge Diagnoses:  Principal Problem:  *Acute respiratory failure  Active Problems:   Bronchiectasis  Recurrent pneumonia  Tachycardia  Hyponatremia  Anemia  Hypokalemia  Iron deficiency anemia    03/09/2012 f/u ov/Beth Larsen cc back to baseline x staying at home. Walking fine and cough is better than it was before the pneumonia.  rec For cough use mucinex or mucinex dm Ok to use nebulizer for now twice daily as needed for bad cough Use zyrtec 10 mg at bedtime for itching or sneezing or runny nose   05/25/2012 f/u ov/Beth Larsen cc back to baseline returns for pfts, no sob and no purulent sputum. Vest seems to help, occ using albuterol also but not for 12 hours before pft's showing no airflow obst at all  No obvious daytime variabilty or assoc   cp or chest tightness, subjective wheeze overt sinus or hb symptoms. No unusual exp hx or h/o childhood pna/ asthma or premature birth to her knowledge.   Sleeping ok without nocturnal  or early am exacerbation  of respiratory  c/o's or need for noct saba. Also denies any obvious fluctuation of symptoms with weather or environmental changes or other aggravating or alleviating factors except as outlined above   ROS  The following are not active complaints unless bolded sore throat, dysphagia, dental problems, itching, sneezing,  nasal congestion or excess/ purulent secretions, ear ache,   fever, chills, sweats, unintended wt loss, pleuritic or exertional cp, hemoptysis,  orthopnea pnd or leg swelling, presyncope, palpitations, heartburn, abdominal pain, anorexia, nausea, vomiting, diarrhea  or change in bowel or urinary habits, change in  stools or urine, dysuria,hematuria,  rash, arthralgias, visual complaints, headache, numbness weakness or ataxia or problems with walking or coordination,  change in mood/affect or memory.                Objective:   Physical Exam  Pleasant reserved thin amb wf nad  Wt 127  07/31/2011 >   03/23/2012  120 > 05/25/12  124  HEENT: nl dentition, turbinates, and orophanx. Nl external ear canals without  cough reflex   NECK :  without JVD/Nodes/TM/ nl carotid upstrokes bilaterally   LUNGS: no acc muscle use, min insp and exp rhonchi but no true wheeze   CV:  RRR  no s3 or murmur or increase in P2, no edema   ABD:  soft and nontender with nl excursion in the supine position. No bruits or organomegaly, bowel sounds nl  MS:  warm without deformities, calf tenderness, cyanosis - MOD CLUBBING bilaterally  SKIN: warm and dry without lesions    NEURO:  alert, approp, no deficits    CXR  05/25/12 1. Improvement in markings noted previously at the right lung base  most likely postinflammatory.  2. Residual prominent markings in the left lower lobe and left  apex. .        Assessment & Plan:

## 2012-05-25 NOTE — Progress Notes (Signed)
PFT done today. 

## 2012-05-25 NOTE — Patient Instructions (Addendum)
For cough use mucinex or mucinex dm  Ok to use nebulizer for now twice daily as needed for bad cough  Use zyrtec 10 mg at bedtime for itching or sneezing or runny nose   Please schedule a follow up office visit in 3 months   Late add: We have not excluded rare forms of CF or mucociliary dysfunction and believe the best place for this is [facility]  Chart review indicates need for prevnar next ov and IgE level, will discuss referral to [facility] then

## 2012-05-26 NOTE — Assessment & Plan Note (Signed)
-   Quantitative Immunoglobulin profile 02/24/12 > nl     - CF mutation screen 02/24/12 > negative    - Alpha one AT 07/31/2011 >  MS with AAT level 65.9 mM (nl)    - PFT's 05/25/12 no airflow obstruction/  VC 65%   She has scarring on cxr explaining the restrictive changes. We have not excluded rare forms of CF, mucociliary dysfunction and believe the best place for this is [facility]  Chart review indicates need for prevnar next ov and IgE level, will discuss referral to [facility] then

## 2012-05-30 NOTE — Progress Notes (Signed)
Quick Note:  Spoke with pt and notified of results per Dr. Wert. Pt verbalized understanding and denied any questions.  ______ 

## 2012-06-14 ENCOUNTER — Encounter: Payer: Self-pay | Admitting: Internal Medicine

## 2012-07-22 ENCOUNTER — Encounter: Payer: Self-pay | Admitting: Adult Health

## 2012-07-22 ENCOUNTER — Ambulatory Visit (INDEPENDENT_AMBULATORY_CARE_PROVIDER_SITE_OTHER)
Admission: RE | Admit: 2012-07-22 | Discharge: 2012-07-22 | Disposition: A | Discharge status: Home / Self Care | Source: Ambulatory Visit | Attending: Adult Health | Admitting: Adult Health

## 2012-07-22 ENCOUNTER — Telehealth: Payer: Self-pay | Admitting: Internal Medicine

## 2012-07-22 ENCOUNTER — Ambulatory Visit (INDEPENDENT_AMBULATORY_CARE_PROVIDER_SITE_OTHER): Admitting: Adult Health

## 2012-07-22 VITALS — BP 96/56 | HR 108 | Temp 99.2°F | Ht 68.0 in | Wt 123.6 lb

## 2012-07-22 DIAGNOSIS — J479 Bronchiectasis, uncomplicated: Secondary | ICD-10-CM

## 2012-07-22 MED ORDER — LEVOFLOXACIN 500 MG PO TABS: 500.0000 mg | ORAL_TABLET | Freq: Every day | ORAL | Status: DC

## 2012-07-22 MED ORDER — FLUTTER DEVI: Status: DC

## 2012-07-22 NOTE — Patient Instructions (Addendum)
Levaquin 500mg  daily for 10 days , if symptoms worsen or do not improve with discolored mucus  Continue with Vest  Up to 3 times daily as needed.  Begin Flutter valve Three times a day   Mucinex Twice daily  As needed  Cough  Zyrtec 10mg  At bedtime  As needed  Drainage  I will call with labs and xray .  Please contact office for sooner follow up if symptoms do not improve or worsen or seek emergency care  Follow up Dr. Sherene Sires in 2 weeks and As needed

## 2012-07-22 NOTE — Progress Notes (Signed)
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 No problem swallowing. No fm hx lung dz/ CF/  No problems as child with pna. rec Bronchiectasis pathophys reviewed Whenever you develop cough congestion take mucinex or mucinex dm > these will help keep the mucus loose and flowing but if your condition worsens you need to seek help immediately preferably here or somewhere inside the [facility] system to compare xrays ( worse = darker or bloody mucus or pain on breathing in)   Please remember to go to the lab department downstairs for your tests  Augmentin 875 one twice daily bfast and supper with a large glass of water and eat culture yogurt for lunch  08/14/2011 f/u ov/Wert > no show  08/26/2011 f/u ov/Wert cc missed appt    Admit date: 02/21/2012  Discharge date: 02/26/2012    Recommendations for Outpatient Follow-up:  1. Will be discharge to home . Will continue with nebs and chest percussion. Has appointment with pulm 03/09/12. Will complete 3 weeks antibiotics Discharge Diagnoses:  Principal Problem:  *Acute  respiratory failure  Active Problems:  Bronchiectasis  Recurrent pneumonia  Tachycardia  Hyponatremia  Anemia  Hypokalemia  Iron deficiency anemia    03/09/2012 f/u ov/Wert cc back to baseline x staying at home. Walking fine and cough is better than it was before the pneumonia.  rec For cough use mucinex or mucinex dm Ok to use nebulizer for now twice daily as needed for bad cough Use zyrtec 10 mg at bedtime for itching or sneezing or runny nose   05/25/2012 f/u ov/Wert cc back to baseline returns for pfts, no sob and no purulent sputum. Vest seems to help, occ using albuterol also but not for 12 hours before pft's showing no airflow obst at all >>zyrtec As needed    07/22/2012 Acute OV  Complains of chest congestion, dry cough x1 day. - denies SOB, wheezing, tightness, f/c/s. No discolored mucus or fever. Appetite is good.  Started VEST therapy yesterday.  Says she has been doing well until yesterday.  No recent travel or abx use Menses are normal , denies chance of pregnancy .  Started albuterol neb yesterday-needs refill.  No overt reflux. No rash , no calf pain .      ROS Constitutional:   No  weight loss, night sweats,  Fevers, chills, fatigue, or  lassitude.  HEENT:   No headaches,  Difficulty swallowing,  Tooth/dental problems, or  Sore throat,                No sneezing, itching,  ear ache,  +nasal congestion, post nasal drip,   CV:  No chest pain,  Orthopnea, PND, swelling in lower extremities, anasarca, dizziness, palpitations, syncope.   GI  No heartburn, indigestion, abdominal pain, nausea, vomiting, diarrhea, change in bowel habits, loss of appetite, bloody stools.   Resp:   No chest wall deformity  Skin: no rash or lesions.  GU: no dysuria, change in color of urine, no urgency or frequency.  No flank pain, no hematuria   MS:  No joint pain or swelling.  No decreased range of motion.  No back pain.  Psych:  No change in mood or affect. No depression or  anxiety.  No memory loss.             Objective:   Physical Exam  Pleasant reserved thin amb wf nad  Wt 127  07/31/2011 >   03/23/2012  120 > 05/25/12  124> 123 07/22/2012   HEENT: nl dentition, turbinates, and orophanx. Nl external ear canals without cough reflex   NECK :  without JVD/Nodes/TM/ nl carotid upstrokes bilaterally   LUNGS: no acc muscle use, min insp and exp rhonchi but no true wheeze   CV:  RRR  no s3 or murmur or increase in P2, no edema   ABD:  soft and nontender with nl excursion in the supine position. No bruits or organomegaly, bowel sounds nl Colostomy bag   MS:  warm without deformities, calf tenderness, cyanosis - MOD CLUBBING bilaterally  SKIN: warm and dry without lesions    NEURO:  alert, approp, no deficits    CXR  05/25/12 1. Improvement in markings noted previously at the right lung base  most likely postinflammatory.  2. Residual prominent markings in the left lower lobe and left  apex. .        Assessment & Plan:

## 2012-07-22 NOTE — Telephone Encounter (Signed)
Called and spoke with pt and she is aware that per MW---needs appt today.  Pt was ok with the appt today with TP at 4:30 and advised the pt to come in around 4:15.  Pt voiced her understanding of this and nothing further is needed.

## 2012-07-22 NOTE — Telephone Encounter (Signed)
If can't get in today to see Beth Larsen then ok to rx with levaquin 750 one daily  X 5 days and see me in 7-10 days to regroup re longterm rx

## 2012-07-22 NOTE — Assessment & Plan Note (Signed)
?   Mild flare w/ URI  Hx of Colitis s/p ileostomy at age 22  Will check labs including ana Beth Larsen /ra factor.  Have her return in 2 weeks for follow up and to discuss referral to [facility]  Check xray today   Plan  Levaquin 500mg  daily for 10 days , if symptoms worsen or do not improve with discolored mucus  Continue with Vest  Up to 3 times daily as needed.  Begin Flutter valve Three times a day   Mucinex Twice daily  As needed  Cough  Zyrtec 10mg  At bedtime  As needed  Drainage  I will call with labs and xray .  Please contact office for sooner follow up if symptoms do not improve or worsen or seek emergency care  follow up Dr. Sherene Sires in 2 weeks and As needed

## 2012-07-22 NOTE — Telephone Encounter (Signed)
Called and spoke with pt and she stated that since yesterday she has been coughing --non productive and she is afraid that she will end up in the hospital.  Pt wanting recs from MW on what she can do to prevent all of this from happening again. MW please advise. Thanks  Allergies  Allergen Reactions  . Augmentin (Amoxicillin-Pot Clavulanate) Rash

## 2012-07-25 ENCOUNTER — Telehealth: Payer: Self-pay | Admitting: *Deleted

## 2012-07-25 DIAGNOSIS — J479 Bronchiectasis, uncomplicated: Secondary | ICD-10-CM

## 2012-07-25 NOTE — Telephone Encounter (Signed)
Message copied by Christen Butter on Mon Jul 25, 2012 11:39 AM ------      Message from: Sandrea Hughs B      Created: Sun Jul 24, 2012 11:49 AM       Agree, and note she has marked clubbing so I considered CF but she's been screened already.            I should see her back and regroup before we do anything else- I'll get Verlon Au to get her back in 2 weeks for f/u with me            thx      ----- Message -----         From: Julio Sicks, NP         Sent: 07/24/2012  10:27 AM           To: Nyoka Cowden, MD            This is a strange case w/ her being so young       Saw her in office , gave abx rx       Xray is still abn , if you want to look at       Do you want me to have her repeat in 2 weeks after abx       Or do you want me to go ahead and scan her                   She has a hx of colitis at age 22 , has a colostomy             Tam        ------

## 2012-07-25 NOTE — Telephone Encounter (Signed)
LMTCB for pt 

## 2012-07-25 NOTE — Telephone Encounter (Signed)
Per 4.11.14 cxr result note:  Result Notes    Notes Recorded by Sherre Lain, MA on 07/25/2012 at 12:48 PM Called spoke with patient, advised of cxr results / recs as stated by TP. Pt stated that her breathing is slightly improved with decreased coughing. 2 week follow up scheduled with MW 4.25.14 @ 4:30pm. Orders only encounter created for cxr order > done STAT. Pt okay with this date and time. ------  Notes Recorded by Julio Sicks, NP on 07/25/2012 at 9:15 AM No significant change in CXR  Will need follow up CXR +/- Ct  follow up with Dr. Sherene Sires In 2 weeks  Please contact office for sooner follow up if symptoms do not improve or worsen or seek emergency care

## 2012-07-25 NOTE — Progress Notes (Signed)
Quick Note:  Called spoke with patient, advised of cxr results / recs as stated by TP. Pt stated that her breathing is slightly improved with decreased coughing. 2 week follow up scheduled with MW 4.25.14 @ 4:30pm. Orders only encounter created for cxr order > done STAT. Pt okay with this date and time. ______

## 2012-08-01 ENCOUNTER — Telehealth: Payer: Self-pay | Admitting: Internal Medicine

## 2012-08-01 MED ORDER — TRAMADOL HCL 50 MG PO TABS: ORAL_TABLET | ORAL | Status: DC

## 2012-08-01 NOTE — Telephone Encounter (Signed)
Be sure on max mucinex dm = 1200 mg every 12 hours and ok to supplment with tramadol 50 mg 1-2 every 4 hours as needed but may cause drowsiness

## 2012-08-01 NOTE — Telephone Encounter (Signed)
Called, spoke with pt.  Informed her of below per MW. She verbalized understanding of these instructions and is aware tramadol rx sent to [facility].  Advised to keep OV on Friday with MW and to call back if anything further is needed prior to this. She verbalized understanding and voiced no further questions or concerns at this time.

## 2012-08-01 NOTE — Telephone Encounter (Signed)
Pt was seen by TP on 07/22/12:     Patient Instructions    Levaquin 500mg  daily for 10 days , if symptoms worsen or do not improve with discolored mucus  Continue with Vest Up to 3 times daily as needed.  Begin Flutter valve Three times a day  Mucinex Twice daily As needed Cough  Zyrtec 10mg  At bedtime As needed Drainage  I will call with labs and xray .  Please contact office for sooner follow up if symptoms do not improve or worsen or seek emergency care  Follow up Dr. Sherene Sires in 2 weeks and As needed   ----- Spoke with pt.  States she finished the levaquin yesterday.  Reports she is coughing "all night" x the last few nights.  Cough is nonprod qhs and prod during the day with "mostly" clear mucus.  Does have PND and nasal congestion with clear mucus.  Denies SOB, wheezing, chest tightness, chest pain, or f/c/s.  She is taking mucinex and zyrtec prn.  Pt has an appt with MW on this coming Friday, April 25.  She would a cough syrup to take qhs.  Dr. Sherene Sires, pls advise.  Thank you.  [facility] [address]  Allergies  Allergen Reactions  . Augmentin (Amoxicillin-Pot Clavulanate) Rash

## 2012-08-05 ENCOUNTER — Other Ambulatory Visit (INDEPENDENT_AMBULATORY_CARE_PROVIDER_SITE_OTHER)

## 2012-08-05 ENCOUNTER — Ambulatory Visit (INDEPENDENT_AMBULATORY_CARE_PROVIDER_SITE_OTHER): Admitting: Internal Medicine

## 2012-08-05 ENCOUNTER — Ambulatory Visit (INDEPENDENT_AMBULATORY_CARE_PROVIDER_SITE_OTHER)
Admission: RE | Admit: 2012-08-05 | Discharge: 2012-08-05 | Disposition: A | Discharge status: Home / Self Care | Source: Ambulatory Visit | Attending: Internal Medicine | Admitting: Internal Medicine

## 2012-08-05 ENCOUNTER — Encounter: Payer: Self-pay | Admitting: Internal Medicine

## 2012-08-05 VITALS — BP 98/62 | HR 106 | Temp 97.3°F | Ht 68.0 in | Wt 121.6 lb

## 2012-08-05 DIAGNOSIS — J479 Bronchiectasis, uncomplicated: Secondary | ICD-10-CM

## 2012-08-05 LAB — CBC WITH DIFFERENTIAL/PLATELET
Basophils Relative: 0.2 % (ref 0.0–3.0)
Eosinophils Absolute: 0.1 10*3/uL (ref 0.0–0.7)
Eosinophils Relative: 0.4 % (ref 0.0–5.0)
HCT: 37.7 % (ref 36.0–46.0)
Lymphs Abs: 0.8 10*3/uL (ref 0.7–4.0)
MCHC: 33.2 g/dL (ref 30.0–36.0)
MCV: 82.2 fl (ref 78.0–100.0)
Monocytes Absolute: 0.7 10*3/uL (ref 0.1–1.0)
Neutro Abs: 14.9 10*3/uL — ABNORMAL HIGH (ref 1.4–7.7)
RBC: 4.59 Mil/uL (ref 3.87–5.11)
WBC: 16.5 10*3/uL — ABNORMAL HIGH (ref 4.5–10.5)

## 2012-08-05 LAB — BASIC METABOLIC PANEL
BUN: 7 mg/dL (ref 6–23)
Calcium: 9.7 mg/dL (ref 8.4–10.5)
Creatinine, Ser: 0.5 mg/dL (ref 0.4–1.2)
GFR: 166.04 mL/min (ref >60.00)
Glucose, Bld: 86 mg/dL (ref 70–99)

## 2012-08-05 MED ORDER — LEVOFLOXACIN 750 MG PO TABS: 750.0000 mg | ORAL_TABLET | Freq: Every day | ORAL | Status: DC

## 2012-08-05 MED ORDER — ALBUTEROL SULFATE (5 MG/ML) 0.5% IN NEBU: 2.5000 mg | INHALATION_SOLUTION | Freq: Three times a day (TID) | RESPIRATORY_TRACT | Status: DC

## 2012-08-05 MED ORDER — PREDNISONE (PAK) 10 MG PO TABS: ORAL_TABLET | ORAL | Status: DC

## 2012-08-05 NOTE — Assessment & Plan Note (Addendum)
-   Assoc with h/o ulcerative colitis    - Quantitative Immunoglobulin profile 02/24/12 > nl     - CF mutation screen 02/24/12 > negative    - Alpha one AT 07/31/2011 >  MS with AAT level 65.9 mM (nl)    - PFT's 05/25/12 no airflow obstruction/  VC 65%     - IgE 08/05/2012 >>>  At this point will extend Levaquin and dose at 750 x 7 more days and refer to [facility] where they have the best bronchiectasis experience since they are also the regional cf center and little else to offer here.

## 2012-08-05 NOTE — Patient Instructions (Addendum)
Levaquin 750 x 7 days Please see patient coordinator before you leave today  to schedule referral to [facility] bronchiectasis  Prednisone 10 mg take  4 each am x 2 days,   2 each am x 2 days,  1 each am x2days and stop   Ok to albuterol a couple of times a day to help with cough or breathing.  Mucinex dm 1200 mg every 12 hours

## 2012-08-05 NOTE — Progress Notes (Signed)
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 No problem swallowing. No fm hx lung dz/ CF/  No problems as child with pna. rec Bronchiectasis pathophys reviewed Whenever you develop cough congestion take mucinex or mucinex dm > these will help keep the mucus loose and flowing but if your condition worsens you need to seek help immediately preferably here or somewhere inside the [facility] system to compare xrays ( worse = darker or bloody mucus or pain on breathing in)   Please remember to go to the lab department downstairs for your tests  Augmentin 875 one twice daily bfast and supper with a large glass of water and eat culture yogurt for lunch  08/14/2011 f/u ov/Wert > no show  08/26/2011 f/u ov/Wert cc missed appt    Admit date: 02/21/2012  Discharge date: 02/26/2012    Recommendations for Outpatient Follow-up:  1. Will be discharge to home . Will continue with nebs and chest percussion. Has appointment with pulm 03/09/12. Will complete 3 weeks antibiotics Discharge Diagnoses:  Principal Problem:  *Acute  respiratory failure  Active Problems:  Bronchiectasis  Recurrent pneumonia  Tachycardia  Hyponatremia  Anemia  Hypokalemia  Iron deficiency anemia    03/09/2012 f/u ov/Wert cc back to baseline x staying at home. Walking fine and cough is better than it was before the pneumonia.  rec For cough use mucinex or mucinex dm Ok to use nebulizer for now twice daily as needed for bad cough Use zyrtec 10 mg at bedtime for itching or sneezing or runny nose   05/25/2012 f/u ov/Wert cc back to baseline returns for pfts, no sob and no purulent sputum. Vest seems to help, occ using albuterol also but not for 12 hours before pft's showing no airflow obst at all rec For cough use mucinex or mucinex dm Ok to use nebulizer for now twice daily as needed for bad cough Use zyrtec 10 mg at bedtime for itching or sneezing or runny nose  Late add: We have not excluded rare forms of CF or mucociliary dysfunction and believe the best place for this is [facility]  Chart review indicates need for prevnar next ov and IgE level, will discuss referral to [facility] then   07/22/2012 Acute OV  Complains of chest congestion, dry cough x1 day. - denies SOB, wheezing, tightness, f/c/s. No discolored mucus or fever. Appetite is good.  Started VEST therapy yesterday.  Says she has been doing well until yesterday.  No recent travel or abx use Menses are normal , denies chance of pregnancy .  Started  albuterol neb yesterday-needs refill.  rec Levaquin 500mg  daily for 10 days , if symptoms worsen or do not improve with discolored mucus  Continue with Vest  Up to 3 times daily as needed.  Begin Flutter valve Three times a day   Mucinex Twice daily  As needed  Cough  Zyrtec 10mg  At bedtime  As needed  Drainage    08/05/2012 acute  ov/Wert bronchiectasis flare  Chief Complaint  Patient presents with  . Follow-up    Cough had improved on levaquin, but started getting worse again about 4 days after finishing med. Cough is prod  with clear to green sputum.    already using vest regularly but no longer has neb. Did the best prolonged rx with levaquin p last admit with no def pathogens identified. No cp or sob.  No obvious daytime variabilty or assoc cp or chest tightness, subjective wheeze overt sinus or hb symptoms. No unusual exp hx or h/o childhood pna/ asthma or premature birth to her  Knowledge.  Sleeping ok without nocturnal  or early am exacerbation  of respiratory  c/o's or need for noct saba. Also denies any obvious fluctuation of symptoms with weather or environmental changes or other aggravating or alleviating factors except as outlined above   Current Medications, Allergies, Past Medical History, Past Surgical History, Family History, and Social History were reviewed in [employer] record.  ROS  The following are not active complaints unless bolded sore throat, dysphagia, dental problems, itching, sneezing,  nasal congestion or excess/ purulent secretions, ear ache,   fever, chills, sweats, unintended wt loss, pleuritic or exertional cp, hemoptysis,  orthopnea pnd or leg swelling, presyncope, palpitations, heartburn, abdominal pain, anorexia, nausea, vomiting, diarrhea  or change in bowel or urinary habits, change in stools or urine, dysuria,hematuria,  rash, arthralgias, visual complaints, headache, numbness weakness or ataxia or problems with walking or coordination,  change in mood/affect or memory.                   Objective:   Physical Exam  Pleasant reserved thin amb wf nad  Wt 127  07/31/2011 >   03/23/2012  120 > 05/25/12  124> 123 07/22/2012 >  122 08/05/2012   HEENT: nl dentition, turbinates, and orophanx. Nl external ear canals without cough reflex   NECK :  without JVD/Nodes/TM/ nl carotid upstrokes bilaterally   LUNGS: no acc muscle use, min insp and exp rhonchi but no true wheeze or bronchial changes    CV:  RRR  no s3 or murmur or increase in P2, no edema    ABD:  soft and nontender with nl excursion in the supine position. No bruits or organomegaly, bowel sounds nl Colostomy bag   MS:  warm without deformities, calf tenderness, cyanosis - MOD CLUBBING bilaterally  SKIN: warm and dry without lesions    NEURO:  alert, approp, no deficits    CXR  08/05/2012 :  1. Continued linear and branching densities at the left lung base, likely representing plugged cylindrical and saccular bronchiectasis as shown on prior exams. Similar but less striking findings at the right lung base. 2. Nodular density at the left lung apex, probably inflammatory. A similar nodular density was shown on the prior CT scan from November  .        Assessment & Plan:

## 2012-08-06 ENCOUNTER — Encounter: Payer: Self-pay | Admitting: Internal Medicine

## 2012-08-06 ENCOUNTER — Telehealth: Payer: Self-pay | Admitting: Pulmonary Disease

## 2012-08-06 LAB — IGE: IgE (Immunoglobulin E), Serum: 34.1 IU/mL (ref 0.0–180.0)

## 2012-08-06 NOTE — Telephone Encounter (Signed)
Albuterol prescription changed from the concentrated form to the premixed form.

## 2012-08-09 ENCOUNTER — Telehealth: Payer: Self-pay | Admitting: Internal Medicine

## 2012-08-09 NOTE — Telephone Encounter (Signed)
Spoke to pt. She states that Dr. Sherene Sires prescribed Levofloxacin and told her to take it for 7 days. #5 was sent to the pharmacy. Pt was wondering if she needed to take 2 more days. I asked if she was feeling better and she stated that she was. I advised that if she was feeling better than 2 more days of antibiotics wasn't necessary. She agreed and verbalized understanding. Nothing further was needed.

## 2012-08-10 ENCOUNTER — Encounter: Payer: Self-pay | Admitting: Adult Health

## 2012-08-10 NOTE — Progress Notes (Signed)
Quick Note:  Called, spoke with pt. Informed her lab results and recs per Dr. Sherene Sires. She verbalized understanding of this and voiced no further questions or concerns at this time. ______

## 2012-08-12 NOTE — Progress Notes (Signed)
Quick Note:  Called spoke with patient, advised of lab results / recs as stated by TP. Pt verbalized her understanding and denied any questions. ______ 

## 2012-08-13 ENCOUNTER — Encounter (HOSPITAL_COMMUNITY): Payer: Self-pay | Admitting: Emergency Medicine

## 2012-08-13 ENCOUNTER — Emergency Department (HOSPITAL_COMMUNITY)

## 2012-08-13 ENCOUNTER — Emergency Department (HOSPITAL_COMMUNITY)
Admission: EM | Admit: 2012-08-13 | Discharge: 2012-08-13 | Disposition: A | Discharge status: Home / Self Care | Attending: Emergency Medicine | Admitting: Emergency Medicine

## 2012-08-13 DIAGNOSIS — R05 Cough: Secondary | ICD-10-CM

## 2012-08-13 DIAGNOSIS — J189 Pneumonia, unspecified organism: Secondary | ICD-10-CM

## 2012-08-13 DIAGNOSIS — R059 Cough, unspecified: Secondary | ICD-10-CM | POA: Insufficient documentation

## 2012-08-13 DIAGNOSIS — Z8719 Personal history of other diseases of the digestive system: Secondary | ICD-10-CM | POA: Insufficient documentation

## 2012-08-13 DIAGNOSIS — Z8701 Personal history of pneumonia (recurrent): Secondary | ICD-10-CM | POA: Insufficient documentation

## 2012-08-13 DIAGNOSIS — J159 Unspecified bacterial pneumonia: Secondary | ICD-10-CM | POA: Insufficient documentation

## 2012-08-13 DIAGNOSIS — Z79899 Other long term (current) drug therapy: Secondary | ICD-10-CM | POA: Insufficient documentation

## 2012-08-13 MED ORDER — HYDROCOD POLST-CHLORPHEN POLST 10-8 MG/5ML PO LQCR: 5.0000 mL | Freq: Two times a day (BID) | ORAL | Status: DC | PRN

## 2012-08-13 MED ORDER — PREDNISONE 20 MG PO TABS
60.0000 mg | ORAL_TABLET | Freq: Once | ORAL | Status: AC
  Administered 2012-08-13: 60 mg via ORAL
  Filled 2012-08-13: qty 1

## 2012-08-13 MED ORDER — PREDNISONE 20 MG PO TABS: 40.0000 mg | ORAL_TABLET | Freq: Every day | ORAL | Status: DC

## 2012-08-13 MED ORDER — ALBUTEROL SULFATE (5 MG/ML) 0.5% IN NEBU
5.0000 mg | INHALATION_SOLUTION | Freq: Once | RESPIRATORY_TRACT | Status: AC
  Administered 2012-08-13: 5 mg via RESPIRATORY_TRACT
  Filled 2012-08-13: qty 1

## 2012-08-13 MED ORDER — IPRATROPIUM BROMIDE 0.02 % IN SOLN
0.5000 mg | Freq: Once | RESPIRATORY_TRACT | Status: AC
  Administered 2012-08-13: 0.5 mg via RESPIRATORY_TRACT
  Filled 2012-08-13: qty 2.5

## 2012-08-13 MED ORDER — AZITHROMYCIN 250 MG PO TABS: 250.0000 mg | ORAL_TABLET | Freq: Every day | ORAL | Status: DC

## 2012-08-13 NOTE — ED Provider Notes (Signed)
History  This chart was scribed for non-physician practitioner working with Gwyneth Sprout, MD by Ardeen Jourdain, ED Scribe. This patient was seen in room WTR9/WTR9 and the patient's care was started at 2017.  CSN: [id number]  Arrival date & time 08/13/12  4098   First MD Initiated Contact with Patient 08/13/12 2017      Chief Complaint  Patient presents with  . Cough     The history is provided by the patient. No language interpreter was used.    Beth Larsen is a 22 y.o. female who presents to the Emergency Department complaining of gradual onset, gradually worsening, constant productive cough that began 2-3 weeks ago. She describes the sputum as green, thick and foul smelling. She states she was evaluated and prescribed Levofloxacin and steroids by her pulmonologist. She reports finishing the medication 3 days ago with no improvement of her sx. She states she was hospitalized with pneumonia 02/2012. She is currently in school and lives in a dorm. She denies any wheezing, SOB, fever, sweats, chills, nausea, vomiting, or abdominal pain.  Pt reports taking albuterol neb this morning with slight relief.  O2 sats 99% on arrival, pt in NAD.   Past Medical History  Diagnosis Date  . Cough   . GERD (gastroesophageal reflux disease)   . PNA (pneumonia)   . Colitis, acute     Past Surgical History  Procedure Laterality Date  . Illeostomy      Family History  Problem Relation Age of Onset  . Other Mother     History  Substance Use Topics  . Smoking status: Never Smoker   . Smokeless tobacco: Never Used  . Alcohol Use: No   No OB history available.   Review of Systems  Respiratory: Positive for cough.   All other systems reviewed and are negative.    Allergies  Augmentin  Home Medications   Current Outpatient Rx  Name  Route  Sig  Dispense  Refill  . albuterol (PROVENTIL) (5 MG/ML) 0.5% nebulizer solution   Nebulization   Take 2.5 mg by nebulization every 6  (six) hours as needed for wheezing or shortness of breath.         . cetirizine (ZYRTEC) 10 MG tablet   Oral   Take 10 mg by mouth daily as needed for allergies.          . pseudoephedrine-guaifenesin (MUCINEX D) 60-600 MG per tablet   Oral   Take 1-2 tablets by mouth every 12 (twelve) hours as needed for congestion.           Triage Vitals: BP 105/71  Pulse 99  Temp(Src) 98.7 F (37.1 C) (Oral)  Resp 18  Ht 5\' 8"  (1.727 m)  Wt 120 lb (54.432 kg)  BMI 18.25 kg/m2  SpO2 99%  LMP 08/10/2012  Physical Exam  Nursing note and vitals reviewed. Constitutional: She is oriented to person, place, and time. She appears well-developed and well-nourished.  HENT:  Head: Normocephalic and atraumatic.  Mouth/Throat: Uvula is midline, oropharynx is clear and moist and mucous membranes are normal. No oropharyngeal exudate, posterior oropharyngeal edema, posterior oropharyngeal erythema or tonsillar abscesses.  Eyes: Conjunctivae and EOM are normal. Pupils are equal, round, and reactive to light.  Neck: Normal range of motion.  Cardiovascular: Normal rate, regular rhythm and normal heart sounds.   Pulmonary/Chest: Effort normal. No accessory muscle usage. No respiratory distress. She has wheezes in the left lower field.  Expiratory wheezes LLF  Abdominal: Soft.  Musculoskeletal:  Normal range of motion.  Neurological: She is alert and oriented to person, place, and time.  Skin: Skin is warm and dry.  Psychiatric: She has a normal mood and affect.    ED Course  Procedures (including critical care time)  DIAGNOSTIC STUDIES: Oxygen Saturation is 99% on room air, normal by my interpretation.    COORDINATION OF CARE:  8:41 PM-Discussed treatment plan which includes CXR, antibiotics and prednisone with pt at bedside and pt agreed to plan.    Labs Reviewed - No data to display Dg Chest 2 View  08/13/2012  *RADIOLOGY REPORT*  Clinical Data: Cough.  CHEST - 2 VIEW  Comparison:  08/05/2012 and 03/23/2012  Findings: Chronic lung changes with superimposed left lower lobe consolidation.  This needs to be followed until clearance. This may represent consolidation of bronchiectasis.  Atypical infection not excluded.  Nodular density left lung apex as previously described.  Scoliosis.  Heart size within normal limits.  No pneumothorax.  IMPRESSION:  Chronic lung changes with superimposed left lower lobe consolidation.  This needs to be followed until clearance. This may represent consolidation of bronchiectasis.  Atypical infection not excluded.  Nodular density left lung apex as previously described.   Original Report Authenticated By: Lacy Duverney, M.D.      1. CAP (community acquired pneumonia)   2. Cough       MDM   Patient presenting to the ED for cough x2-3 weeks. Patient recently completed trial of levofloxacin without resolution of sx.  Chest x-ray consistent with CAP, left lobar consolidation.  Good relief of symptoms with albuterol and Atrovent nebulizer and prednisone. Pt afebrile, non-toxic appearing, NAD, VS stable.  Patient will be started on azithromycin for atypical pneumonia coverage.  Rx Tussionex and prednisone. Followup with primary care physician to ensure resolution of pneumonia. Discussed plan with patient, she agreed. Return precautions advised.   I personally performed the services described in this documentation, which was scribed in my presence. The recorded information has been reviewed and is accurate.    Garlon Hatchet, PA-C 08/13/12 2154  Garlon Hatchet, PA-C 08/13/12 2154

## 2012-08-13 NOTE — ED Notes (Signed)
She finished her last treatment and ahs not coughed x 5 days

## 2012-08-13 NOTE — ED Provider Notes (Signed)
Medical screening examination/treatment/procedure(s) were performed by non-physician practitioner and as supervising physician I was immediately available for consultation/collaboration.   Gwyneth Sprout, MD 08/13/12 [phone]

## 2012-08-13 NOTE — ED Notes (Signed)
Patient states that she had a cough 2 -3 weeks ago. She reports that she had been on antibiotics and prednisone in the past for the same. Is followed by [facility] for this same

## 2012-09-30 ENCOUNTER — Telehealth: Payer: Self-pay | Admitting: Internal Medicine

## 2012-09-30 NOTE — Telephone Encounter (Signed)
Unable to reach at that number

## 2012-09-30 NOTE — Telephone Encounter (Signed)
Spoke with Dr. Garner Nash @ [facility] [phone]  re: abx that pt was given when in [facility] hospital (in Nov 2013).   Pt is being admitted to [facility]  Dr Garner Nash wanted to verify abx given to pt last time admitted---pt reported to her that she did great after having taken those medications.  Dr Garner Nash also needing verification of Sputum Cx being done while in hosp as well.   Will send to Dr Sherene Sires to make him aware that pt is being admitted to [facility].

## 2013-10-18 ENCOUNTER — Ambulatory Visit: Attending: Orthopaedic Surgery | Admitting: Physical Therapy

## 2013-10-18 DIAGNOSIS — M25519 Pain in unspecified shoulder: Secondary | ICD-10-CM | POA: Insufficient documentation

## 2013-10-25 ENCOUNTER — Ambulatory Visit: Admitting: Physical Therapy

## 2013-10-25 DIAGNOSIS — M25519 Pain in unspecified shoulder: Secondary | ICD-10-CM | POA: Diagnosis not present

## 2013-11-01 ENCOUNTER — Ambulatory Visit: Admitting: Physical Therapy

## 2013-11-01 DIAGNOSIS — M25519 Pain in unspecified shoulder: Secondary | ICD-10-CM | POA: Diagnosis not present

## 2013-11-08 ENCOUNTER — Ambulatory Visit: Admitting: Physical Therapy

## 2013-11-08 DIAGNOSIS — M25519 Pain in unspecified shoulder: Secondary | ICD-10-CM | POA: Diagnosis not present

## 2013-11-15 ENCOUNTER — Ambulatory Visit: Attending: Orthopaedic Surgery | Admitting: Physical Therapy

## 2013-11-15 DIAGNOSIS — M25519 Pain in unspecified shoulder: Secondary | ICD-10-CM | POA: Insufficient documentation

## 2014-01-10 ENCOUNTER — Other Ambulatory Visit: Payer: Self-pay | Admitting: Orthopaedic Surgery

## 2014-01-10 DIAGNOSIS — M25512 Pain in left shoulder: Secondary | ICD-10-CM

## 2014-01-22 ENCOUNTER — Ambulatory Visit
Admission: RE | Admit: 2014-01-22 | Discharge: 2014-01-22 | Disposition: A | Discharge status: Home / Self Care | Source: Ambulatory Visit | Attending: Orthopaedic Surgery | Admitting: Orthopaedic Surgery

## 2014-01-22 DIAGNOSIS — M25512 Pain in left shoulder: Secondary | ICD-10-CM

## 2014-01-22 MED ORDER — IOHEXOL 180 MG/ML  SOLN: 15.0000 mL | Freq: Once | INTRAMUSCULAR | Status: AC | PRN

## 2014-03-15 ENCOUNTER — Ambulatory Visit: Attending: Orthopaedic Surgery | Admitting: Physical Therapy

## 2014-03-15 DIAGNOSIS — M419 Scoliosis, unspecified: Secondary | ICD-10-CM | POA: Diagnosis not present

## 2014-03-15 DIAGNOSIS — M25512 Pain in left shoulder: Secondary | ICD-10-CM | POA: Diagnosis not present

## 2014-03-15 DIAGNOSIS — M25519 Pain in unspecified shoulder: Secondary | ICD-10-CM | POA: Diagnosis present

## 2014-03-22 ENCOUNTER — Ambulatory Visit: Admitting: Physical Therapy

## 2014-03-22 DIAGNOSIS — M25512 Pain in left shoulder: Secondary | ICD-10-CM | POA: Diagnosis not present

## 2014-03-29 ENCOUNTER — Ambulatory Visit: Admitting: Physical Therapy

## 2014-03-29 DIAGNOSIS — M25512 Pain in left shoulder: Secondary | ICD-10-CM | POA: Diagnosis not present

## 2014-04-19 ENCOUNTER — Ambulatory Visit: Attending: Orthopaedic Surgery | Admitting: Physical Therapy

## 2014-04-19 DIAGNOSIS — M25512 Pain in left shoulder: Secondary | ICD-10-CM | POA: Diagnosis not present

## 2014-04-19 DIAGNOSIS — M25519 Pain in unspecified shoulder: Secondary | ICD-10-CM | POA: Diagnosis present

## 2014-04-19 DIAGNOSIS — M419 Scoliosis, unspecified: Secondary | ICD-10-CM | POA: Insufficient documentation

## 2016-03-31 ENCOUNTER — Emergency Department (HOSPITAL_COMMUNITY)

## 2016-03-31 ENCOUNTER — Emergency Department (HOSPITAL_COMMUNITY)
Admission: EM | Admit: 2016-03-31 | Discharge: 2016-03-31 | Disposition: A | Discharge status: Home / Self Care | Attending: Emergency Medicine | Admitting: Emergency Medicine

## 2016-03-31 ENCOUNTER — Encounter (HOSPITAL_COMMUNITY): Payer: Self-pay

## 2016-03-31 DIAGNOSIS — J989 Respiratory disorder, unspecified: Secondary | ICD-10-CM | POA: Insufficient documentation

## 2016-03-31 DIAGNOSIS — J988 Other specified respiratory disorders: Secondary | ICD-10-CM

## 2016-03-31 MED ORDER — DOXYCYCLINE HYCLATE 100 MG PO TABS
100.0000 mg | ORAL_TABLET | Freq: Once | ORAL | Status: AC
  Administered 2016-03-31: 100 mg via ORAL
  Filled 2016-03-31: qty 1

## 2016-03-31 MED ORDER — DOXYCYCLINE HYCLATE 100 MG PO CAPS: 100.0000 mg | ORAL_CAPSULE | Freq: Two times a day (BID) | ORAL | 0 refills | Status: DC

## 2016-03-31 MED ORDER — BENZONATATE 100 MG PO CAPS: 100.0000 mg | ORAL_CAPSULE | Freq: Three times a day (TID) | ORAL | 0 refills | Status: DC

## 2016-03-31 NOTE — Discharge Instructions (Signed)
Your chest x-ray still showed signs of infection in the bottom of the left lung. We are giving you a new antibiotic to try to fight this infection. You will need to follow up with pulmonology and a primary care provider as soon as possible. Inquiries have been made for you to try to get you appointments.  Please return to ED should symptoms worsen or any other concerns arise.

## 2016-03-31 NOTE — ED Provider Notes (Signed)
[facility] DEPT Provider Note   CSN: [id number] Arrival date & time: 03/31/16  1508   By signing my name below, I, Hilbert Odor, attest that this documentation has been prepared under the direction and in the presence of Shabnam Ladd, PA-C. Electronically Signed: Hilbert Odor, Scribe. 03/31/16. 3:37 PM. History   Chief Complaint Chief Complaint  Patient presents with  . Cough     The history is provided by the patient. No language interpreter was used.    HPI Comments: Beth Larsen is a 25 y.o. female who presents to the Emergency Department complaining of an unresolved cough for the past month. She reports being seen at an UC a few weeks ago where she was prescribed Levaquin 500 mg x 5 days for PNA. She states her symptoms were unchanged by that and signs of PNA on CXR persisted so she was then started on Levaquin 750 mg x 10 days. After that, her symptoms improved until a few days ago when her cough with associated fatigue returned. She states her cough is productive of green, foul-tasting sputum as well as foul-smelling nasal mucus. She denies fever, SOB, abdominal pain, vomiting, hemoptysis, CP, or any other complaints. She denies history of HIV, DM, Immunocompromise state, or taking immune-modulating medications. She has no PCP follow up at this time.   She reports a history of recurrent PNA and bronchiectasis. She is not currently on any immunosuppressive therapy and has not been able to follow up with [facility] pulmonology or take her prescried Pulmozyme inhalent for about 3 years due to lack of insurance. She denies known diagnosis of cystic fibrosis but she has a percussion vest which she uses daily.   Past Medical History:  Diagnosis Date  . Colitis, acute   . Cough   . GERD (gastroesophageal reflux disease)   . PNA (pneumonia)     Patient Active Problem List   Diagnosis Date Noted  . Hypokalemia 02/23/2012  . Iron deficiency anemia 02/23/2012  . Anemia 02/22/2012    . Sepsis ([place]) 02/21/2012  . Recurrent pneumonia 02/21/2012  . Tachycardia 02/21/2012  . Leukocytosis 02/21/2012  . Lactic acidosis 02/21/2012  . Hyponatremia 02/21/2012  . Acute respiratory failure ([place]) 02/21/2012  . GERD (gastroesophageal reflux disease)   . Colitis, acute   . Bronchiectasis ([place]) 07/31/2011    Past Surgical History:  Procedure Laterality Date  . illeostomy      OB History    No data available       Home Medications    Prior to Admission medications   Medication Sig Start Date End Date Taking? Authorizing Provider  albuterol (PROVENTIL) (5 MG/ML) 0.5% nebulizer solution Take 2.5 mg by nebulization every 6 (six) hours as needed for wheezing or shortness of breath. 08/05/12   Tanda Rockers, MD  azithromycin (ZITHROMAX Z-PAK) 250 MG tablet Take 1 tablet (250 mg total) by mouth daily. 500mg  PO day 1, then 250mg  PO days 205 08/13/12   Larene Pickett, PA-C  benzonatate (TESSALON) 100 MG capsule Take 1 capsule (100 mg total) by mouth every 8 (eight) hours. 03/31/16   Ronnie Doo C Joory Gough, PA-C  cetirizine (ZYRTEC) 10 MG tablet Take 10 mg by mouth daily as needed for allergies.     Historical Provider, MD  chlorpheniramine-HYDROcodone (TUSSIONEX PENNKINETIC ER) 10-8 MG/5ML LQCR Take 5 mLs by mouth every 12 (twelve) hours as needed (Cough). 08/13/12   Larene Pickett, PA-C  doxycycline (VIBRAMYCIN) 100 MG capsule Take 1 capsule (100 mg total) by mouth  2 (two) times daily. 03/31/16   Dan Scearce C Algie Westry, PA-C  predniSONE (DELTASONE) 20 MG tablet Take 2 tablets (40 mg total) by mouth daily. 08/13/12   Larene Pickett, PA-C  pseudoephedrine-guaifenesin [facility] D) 60-600 MG per tablet Take 1-2 tablets by mouth every 12 (twelve) hours as needed for congestion.    Historical Provider, MD    Family History Family History  Problem Relation Age of Onset  . Other Mother     Social History Social History  Substance Use Topics  . Smoking status: Never Smoker  . Smokeless tobacco: Never Used   . Alcohol use No     Allergies   Augmentin [amoxicillin-pot clavulanate]   Review of Systems Review of Systems  Constitutional: Negative for chills, diaphoresis and fever.  Respiratory: Positive for cough. Negative for shortness of breath.        -hemoptysis   Cardiovascular: Negative for chest pain.  Gastrointestinal: Negative for abdominal pain, nausea and vomiting.  Skin: Negative for rash.  All other systems reviewed and are negative.    Physical Exam Updated Vital Signs BP 131/91 (BP Location: Left Arm)   Pulse 98   Temp 98.4 F (36.9 C) (Oral)   Resp 18   Ht 5\' 8"  (1.727 m)   Wt 168 lb (76.2 kg)   LMP 02/20/2016   SpO2 100%   BMI 25.54 kg/m   Physical Exam  Constitutional: She appears well-developed and well-nourished. No distress.  HENT:  Head: Normocephalic and atraumatic.  Eyes: Conjunctivae are normal.  Neck: Normal range of motion. Neck supple.  Cardiovascular: Normal rate, regular rhythm, normal heart sounds and intact distal pulses.   Pulmonary/Chest: Effort normal and breath sounds normal. No respiratory distress. She has no wheezes. She has no rales.  No increased work of breathing. Speaks in full sentences.   Abdominal: Soft. She exhibits no distension. There is no tenderness. There is no guarding.  Musculoskeletal: She exhibits no edema.  Lymphadenopathy:    She has no cervical adenopathy.  Neurological: She is alert.  Skin: Skin is warm and dry. She is not diaphoretic.  Psychiatric: She has a normal mood and affect. Her behavior is normal.  Nursing note and vitals reviewed.    ED Treatments / Results  DIAGNOSTIC STUDIES: Oxygen Saturation is 100% on RA, normal by my interpretation.    COORDINATION OF CARE: 3:37 PM Discussed treatment plan with pt at bedside and pt agreed to plan.  Labs (all labs ordered are listed, but only abnormal results are displayed) Labs Reviewed - No data to display  EKG  EKG Interpretation None        Radiology Dg Chest 2 View  Result Date: 03/31/2016 CLINICAL DATA:  Patient with trach placed on antibiotics on March 06, 2016 with symptomatic improvement but productive cough returned 3-4 days ago. History of bronchiectasis. Recurrent episodes of pneumonia. EXAM: CHEST  2 VIEW COMPARISON:  Chest x-ray of Aug 13, 2012 FINDINGS: The lungs are hyperinflated. There is abnormal increased density in the left lower lobe similar to that seen in the past which likely reflects fluid-filled bronchiectatic long. The right lung base is clear. Elsewhere the lungs are clear. The heart and pulmonary vascularity are normal. The mediastinum is normal in width. The bony thorax exhibits no acute abnormality. IMPRESSION: Bronchiectatic changes versus pneumonia in the left lower lobe. Followup PA and lateral chest X-ray is recommended in 3-4 weeks following trial of antibiotic therapy to ensure resolution and exclude underlying malignancy. Chest CT scanning  may be ultimately needed if the patient's symptoms do not respond to this additional course of antibiotics. Electronically Signed   By: David  Martinique M.D.   On: 03/31/2016 16:10    Procedures Procedures (including critical care time)  Medications Ordered in ED Medications  doxycycline (VIBRA-TABS) tablet 100 mg (100 mg Oral Given 03/31/16 1700)     Initial Impression / Assessment and Plan / ED Course  I have reviewed the triage vital signs and the nursing notes.  Pertinent labs & imaging results that were available during my care of the patient were reviewed by me and considered in my medical decision making (see chart for details).  Clinical Course     Patient presents with persistent productive cough for the last month despite therapy with Levaquin. Chest x-ray shows area suspicious for pneumonia, however, patient is nontoxic appearing, afebrile, not tachycardic, not tachypneic, not hypotensive, maintains SPO2 of 100% on room air, and is in no  apparent distress. Patient has no signs of sepsis or other serious or life-threatening condition. I believe that a chest CT would have low yield for this patient at this time. Enlisting case management to ensure she has close follow up.   Case management will attempt to get her an appt with [facility] as well as the [facility] pul[facility]ology clinic for close follow up and chronic management. Resources were provided by case management to assist patient with getting her necessary medications. Patient was given instructions for home care as well as strict return precautions. Patient voiced understanding of all instructions and is comfortable with discharge.  Findings and plan of care discussed with Veryl Speak, MD.   Vitals:   03/31/16 1512 03/31/16 1514 03/31/16 1801  BP: 131/91  115/77  Pulse: 98  97  Resp: 18  20  Temp: 98.4 F (36.9 C)    TempSrc: Oral    SpO2: 100%  99%  Weight:  76.2 kg   Height:  5\' 8"  (1.727 m)      Final Clinical Impressions(s) / ED Diagnoses   Final diagnoses:  Respiratory infection    New Prescriptions Discharge Medication List as of 03/31/2016  5:55 PM    START taking these medications   Details  benzonatate (TESSALON) 100 MG capsule Take 1 capsule (100 mg total) by mouth every 8 (eight) hours., Starting Tue 03/31/2016, Print    doxycycline (VIBRAMYCIN) 100 MG capsule Take 1 capsule (100 mg total) by mouth 2 (two) times daily., Starting Tue 03/31/2016, Print       I personally performed the services described in this documentation, which was scribed in my presence. The recorded information has been reviewed and is accurate.    Lorayne Bender, PA-C 03/31/16 2035    Lorayne Bender, PA-C 04/01/16 [place], MD 04/01/16 [phone]

## 2016-03-31 NOTE — ED Triage Notes (Signed)
Patient was placed on Levofloxacin 750 mg on 03/06/16. Patient states she felt better and cough returned 3-4 days ago and is now having a productive cough with green sputum.

## 2016-03-31 NOTE — Progress Notes (Signed)
[facility] consulted for pcp and pulmonary follow up.  EDCM received patient's permission to email [facility] in attempts to obtain appointment.  Also sent inbox message to [facility] pulmonary clinic for appointment.  EDCM also provided patient with coupon from [employer] for antibiotic.  EDCM also provided patient with the following: [facility] provided patient with contact information to [facility], informed patient of services there.  EDCM also provided patient with list of pcps who accept self pay patients, list of discount pharmacies and websites needymeds.org and [employer]RX[employer]com for medication assistance, phone number to inquire about the orange card, phone number to inquire about Medicaid, phone number to inquire about the [place], financial resources in the community such as local churches, [employer], urban ministries, and dental assistance for uninsured patients.  Patient thankful for resources.  No further EDCM needs at this time.

## 2016-04-01 ENCOUNTER — Telehealth: Payer: Self-pay

## 2016-04-01 ENCOUNTER — Telehealth: Payer: Self-pay | Admitting: Internal Medicine

## 2016-04-01 NOTE — Telephone Encounter (Signed)
Laurena Slimmer, RN  P [facility]        This patient needs follow-up from the ED for long standing hx of respiratory issues. ARF, recurrent pneumonia.  Thank you,       Laurena Slimmer RN BSN ED Case Manager   [phone]                     lmtcb x1 for pt to make appointment.

## 2016-04-01 NOTE — Telephone Encounter (Signed)
This Case Manager received communication from Livia Snellen, RN CM that patient uninsured and needing ED follow-up appointment for respiratory infection. Call placed to patient at [phone] to schedule appointment; however, unable to reach patient. HIPPA compliant voicemail left requesting return call.

## 2016-04-02 ENCOUNTER — Telehealth: Payer: Self-pay

## 2016-04-02 NOTE — Telephone Encounter (Signed)
This Case Manager received communication from Livia Snellen, RN CM that patient uninsured and needing ED follow-up appointment for respiratory infection. An additional call placed to patient at [phone] to schedule appointment; however, unable to reach patient. HIPPA compliant voicemail left requesting return call.

## 2016-04-03 ENCOUNTER — Telehealth: Payer: Self-pay

## 2016-04-03 NOTE — Telephone Encounter (Signed)
Missed call received from patient returning call to this Case Manager. However, [facility] does not have any available hospital follow-up appointments at this time. Call placed to patient to inform her she will be contacted again if an appointment available.  Unable to reach.

## 2016-04-08 ENCOUNTER — Telehealth: Payer: Self-pay

## 2016-04-08 NOTE — Telephone Encounter (Signed)
lmtcb x2 for pt. 

## 2016-04-08 NOTE — Telephone Encounter (Signed)
Attempted to contact the patient to discuss scheduling a follow up appointment at the [facility]. Call placed to # [phone] (M) and a HIPAA compliant voicemail message was left requesting a call back to # [phone] or [phone].

## 2016-04-15 NOTE — Telephone Encounter (Signed)
We have attempted to contact pt on several occassions with no success or call back. Message will be closed.

## 2016-09-19 ENCOUNTER — Emergency Department (HOSPITAL_COMMUNITY)
Admission: EM | Admit: 2016-09-19 | Discharge: 2016-09-19 | Disposition: A | Discharge status: Home / Self Care | Attending: Emergency Medicine | Admitting: Emergency Medicine

## 2016-09-19 ENCOUNTER — Encounter (HOSPITAL_COMMUNITY): Payer: Self-pay | Admitting: Emergency Medicine

## 2016-09-19 DIAGNOSIS — R519 Headache, unspecified: Secondary | ICD-10-CM

## 2016-09-19 DIAGNOSIS — R51 Headache: Secondary | ICD-10-CM | POA: Insufficient documentation

## 2016-09-19 DIAGNOSIS — Z79899 Other long term (current) drug therapy: Secondary | ICD-10-CM | POA: Insufficient documentation

## 2016-09-19 DIAGNOSIS — R05 Cough: Secondary | ICD-10-CM | POA: Insufficient documentation

## 2016-09-19 DIAGNOSIS — R112 Nausea with vomiting, unspecified: Secondary | ICD-10-CM | POA: Insufficient documentation

## 2016-09-19 DIAGNOSIS — R059 Cough, unspecified: Secondary | ICD-10-CM

## 2016-09-19 DIAGNOSIS — J0100 Acute maxillary sinusitis, unspecified: Secondary | ICD-10-CM

## 2016-09-19 MED ORDER — METOCLOPRAMIDE HCL 10 MG PO TABS: 10.0000 mg | ORAL_TABLET | Freq: Four times a day (QID) | ORAL | 0 refills | Status: AC | PRN

## 2016-09-19 MED ORDER — DIPHENHYDRAMINE HCL 50 MG/ML IJ SOLN
25.0000 mg | Freq: Once | INTRAMUSCULAR | Status: AC
  Administered 2016-09-19: 25 mg via INTRAVENOUS
  Filled 2016-09-19: qty 1

## 2016-09-19 MED ORDER — DOXYCYCLINE HYCLATE 100 MG PO CAPS: 100.0000 mg | ORAL_CAPSULE | Freq: Two times a day (BID) | ORAL | 0 refills | Status: AC

## 2016-09-19 MED ORDER — KETOROLAC TROMETHAMINE 30 MG/ML IJ SOLN
30.0000 mg | Freq: Once | INTRAMUSCULAR | Status: AC
  Administered 2016-09-19: 30 mg via INTRAVENOUS
  Filled 2016-09-19: qty 1

## 2016-09-19 MED ORDER — SODIUM CHLORIDE 0.9 % IV BOLUS (SEPSIS)
1000.0000 mL | Freq: Once | INTRAVENOUS | Status: AC
  Administered 2016-09-19: 1000 mL via INTRAVENOUS

## 2016-09-19 MED ORDER — METOCLOPRAMIDE HCL 5 MG/ML IJ SOLN
10.0000 mg | Freq: Once | INTRAMUSCULAR | Status: AC
  Administered 2016-09-19: 10 mg via INTRAVENOUS
  Filled 2016-09-19: qty 2

## 2016-09-19 MED ORDER — DEXAMETHASONE SODIUM PHOSPHATE 10 MG/ML IJ SOLN
10.0000 mg | Freq: Once | INTRAMUSCULAR | Status: AC
  Administered 2016-09-19: 10 mg via INTRAMUSCULAR
  Filled 2016-09-19: qty 1

## 2016-09-19 NOTE — ED Notes (Signed)
PT DISCHARGED. INSTRUCTIONS AND PRESCRIPTIONS GIVEN. AAOX4. PT IN NO APPARENT DISTRESS WITH MILD PAIN. THE OPPORTUNITY TO ASK QUESTIONS WAS PROVIDED. 

## 2016-09-19 NOTE — ED Triage Notes (Signed)
Pt from home with complaints of headache every day x 1 month. Pt states she has had recent stress from losing her job. Pt states that she thinks her job may have been part of the cause of her headaches as she spent much of the day looking at a computer screen. Pt states she also has wisdom teeth coming in that could be contributing to her headaches. Pt reports nausea when her pain intensifies and states that last night "when her head was hurting badly she coughed a lot then her nose started to bleed". Pt currently rates her pain around 4-6/10 and has an unremarkable neuro exam.

## 2016-09-19 NOTE — ED Provider Notes (Signed)
[facility] DEPT Provider Note   CSN: [id number] Arrival date & time: 09/19/16  1352     History   Chief Complaint Chief Complaint  Patient presents with  . Headache    HPI Beth Larsen is a 26 y.o. 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 Patient states that she stares at a computer all day and thinks that could be causing headaches, as well as reporting that her wisdom teeth have been coming in which could be contributing. She also mentions that she's had some nasal congestion that she attributed to allergies. She reports that one time she had a nosebleed yesterday after she coughed, but it stopped quickly without any intervention. She mentions she's had a dry cough which she also thought was due to allergies. She describes her headache as 4/10 intermittent generalized nonradiating throbbing and dull headaches that wax and wane in severity, worse with bright lights at times, and minimally improved with Motrin, Aleve, Tylenol, and caffeine. She reports that occasionally when the headaches get really bad she feels nauseated, and yesterday she had one episode of nonbloody nonbilious emesis. She is a nonsmoker with no sick contacts. She denies any fevers, chills, rhinorrhea, sore throat, ear pain or drainage, vision changes, neck stiffness, chest pain, shortness breath, wheezing, lightheadedness, abdominal pain, hematemesis, diarrhea, constipation, hematochezia, melena, dysuria, hematuria, myalgias, arthralgias, numbness, tingling, focal weakness, or any other complaints at this time.   The history is provided by the patient and medical records. No language interpreter was used.  Headache   This is a recurrent problem. The current episode started more than 1 week ago. Episode frequency: daily. The problem has not changed since onset.Associated with: staring  at computer screen at work, wisdom teeth coming in, sinus congestion. Pain location: generalized. The quality of the pain is described as dull and throbbing. The pain is at a severity of 4/10. The pain is mild. The pain does not radiate. Associated symptoms include nausea and vomiting. Pertinent negatives include no fever, no near-syncope and no shortness of breath. She has tried NSAIDs and acetaminophen for the symptoms. The treatment provided mild relief.    Past Medical History:  Diagnosis Date  . Colitis, acute   . Cough   . GERD (gastroesophageal reflux disease)   . PNA (pneumonia)     Patient Active Problem List   Diagnosis Date Noted  . Hypokalemia 02/23/2012  . Iron deficiency anemia 02/23/2012  . Anemia 02/22/2012  . Sepsis ([place]) 02/21/2012  . Recurrent pneumonia 02/21/2012  . Tachycardia 02/21/2012  . Leukocytosis 02/21/2012  . Lactic acidosis 02/21/2012  . Hyponatremia 02/21/2012  . Acute respiratory failure ([place]) 02/21/2012  . GERD (gastroesophageal reflux disease)   . Colitis, acute   . Bronchiectasis ([place]) 07/31/2011    Past Surgical History:  Procedure Laterality Date  . illeostomy      OB History    No data available       Home Medications    Prior to Admission medications   Medication Sig Start Date End Date Taking? Authorizing Provider  albuterol (PROVENTIL) (5 MG/ML) 0.5% nebulizer solution Take 2.5 mg by nebulization every 6 (six) hours as needed for wheezing or shortness of breath. 08/05/12   Tanda Rockers, MD  azithromycin (ZITHROMAX Z-PAK) 250 MG tablet Take 1 tablet (250 mg total) by mouth daily. 500mg  PO day 1, then 250mg  PO days  205 08/13/12   Larene Pickett, PA-C  benzonatate (TESSALON) 100 MG capsule Take 1 capsule (100 mg total) by mouth every 8 (eight) hours. 03/31/16   Joy, Shawn C, PA-C  cetirizine (ZYRTEC) 10 MG tablet Take 10 mg by mouth daily as needed for allergies.     [provider]  chlorpheniramine-HYDROcodone (TUSSIONEX  PENNKINETIC ER) 10-8 MG/5ML LQCR Take 5 mLs by mouth every 12 (twelve) hours as needed (Cough). 08/13/12   Larene Pickett, PA-C  doxycycline (VIBRAMYCIN) 100 MG capsule Take 1 capsule (100 mg total) by mouth 2 (two) times daily. 03/31/16   Joy, Shawn C, PA-C  predniSONE (DELTASONE) 20 MG tablet Take 2 tablets (40 mg total) by mouth daily. 08/13/12   Larene Pickett, PA-C  pseudoephedrine-guaifenesin [facility] D) 60-600 MG per tablet Take 1-2 tablets by mouth every 12 (twelve) hours as needed for congestion.    [provider]    Family History Family History  Problem Relation Age of Onset  . Other Mother     Social History Social History  Substance Use Topics  . Smoking status: Never Smoker  . Smokeless tobacco: Never Used  . Alcohol use No     Allergies   Augmentin [amoxicillin-pot clavulanate]   Review of Systems Review of Systems  Constitutional: Negative for chills and fever.  HENT: Positive for congestion. Negative for ear discharge, ear pain, rhinorrhea and sore throat.   Eyes: Negative for visual disturbance.  Respiratory: Positive for cough. Negative for shortness of breath and wheezing.   Cardiovascular: Negative for chest pain and near-syncope.  Gastrointestinal: Positive for nausea and vomiting. Negative for abdominal pain, blood in stool, constipation and diarrhea.  Genitourinary: Negative for dysuria and hematuria.  Musculoskeletal: Negative for arthralgias, myalgias and neck stiffness.  Skin: Negative for color change.  Allergic/Immunologic: Negative for immunocompromised state.  Neurological: Positive for headaches. Negative for weakness, light-headedness and numbness.  Psychiatric/Behavioral: Negative for confusion.   All other systems reviewed and are negative for acute change except as noted in the HPI.    Physical Exam Updated Vital Signs BP 138/82 (BP Location: Left Arm)   Pulse 91   Temp 99 F (37.2 C) (Oral)   LMP 09/16/2016   SpO2 100%    Physical Exam  Constitutional: She is oriented to person, place, and time. Vital signs are normal. She appears well-developed and well-nourished.  Non-toxic appearance. No distress.  Afebrile, nontoxic, NAD  HENT:  Head: Normocephalic and atraumatic.  Nose: Mucosal edema present. No rhinorrhea. Right sinus exhibits no maxillary sinus tenderness and no frontal sinus tenderness. Left sinus exhibits maxillary sinus tenderness. Left sinus exhibits no frontal sinus tenderness.  Mouth/Throat: Uvula is midline, oropharynx is clear and moist and mucous membranes are normal. No trismus in the jaw. No uvula swelling. Tonsils are 0 on the right. Tonsils are 0 on the left. No tonsillar exudate.  Nose with mild congestion, and mild L maxillary sinus TTP. Oropharynx clear and moist, without uvular swelling or deviation, no trismus or drooling, no tonsillar swelling or erythema, no exudates.    Eyes: Conjunctivae and EOM are normal. Pupils are equal, round, and reactive to light. Right eye exhibits no discharge. Left eye exhibits no discharge.  PERRL, EOMI, no nystagmus, no visual field deficits   Neck: Normal range of motion. Neck supple. No spinous process tenderness and no muscular tenderness present. No neck rigidity. Normal range of motion present.  FROM intact without spinous process TTP, no bony stepoffs or deformities,  no paraspinous muscle TTP or muscle spasms. No rigidity or meningeal signs. No bruising or swelling.   Cardiovascular: Normal rate, regular rhythm, normal heart sounds and intact distal pulses.  Exam reveals no gallop and no friction rub.   No murmur heard. Pulmonary/Chest: Effort normal and breath sounds normal. No respiratory distress. She has no decreased breath sounds. She has no wheezes. She has no rhonchi. She has no rales.  Abdominal: Soft. Normal appearance and bowel sounds are normal. She exhibits no distension. There is no tenderness. There is no rigidity, no rebound, no  guarding, no CVA tenderness, no tenderness at McBurney's point and negative Murphy's sign.  Ileostomy in RLQ intact with no surrounding erythema or skin breakdown  Musculoskeletal: Normal range of motion.  MAE x4 Strength and sensation grossly intact in all extremities Distal pulses intact Gait steady  Neurological: She is alert and oriented to person, place, and time. She has normal strength. No cranial nerve deficit or sensory deficit. Coordination and gait normal. GCS eye subscore is 4. GCS verbal subscore is 5. GCS motor subscore is 6.  CN 2-12 grossly intact A&O x4 GCS 15 Sensation and strength intact Gait nonataxic including with tandem walking Coordination with finger-to-nose WNL Neg pronator drift   Skin: Skin is warm, dry and intact. No rash noted.  Psychiatric: She has a normal mood and affect.  Nursing note and vitals reviewed.    ED Treatments / Results  Labs (all labs ordered are listed, but only abnormal results are displayed) Labs Reviewed - No data to display  EKG  EKG Interpretation None       Radiology No results found.  Procedures Procedures (including critical care time)  Medications Ordered in ED Medications  metoCLOPramide (REGLAN) injection 10 mg (10 mg Intravenous Given 09/19/16 1534)    And  diphenhydrAMINE (BENADRYL) injection 25 mg (25 mg Intravenous Given 09/19/16 1533)    And  sodium chloride 0.9 % bolus 1,000 mL (0 mLs Intravenous Stopped 09/19/16 1627)    And  ketorolac (TORADOL) 30 MG/ML injection 30 mg (30 mg Intravenous Given 09/19/16 1533)  dexamethasone (DECADRON) injection 10 mg (10 mg Intramuscular Given 09/19/16 1533)     Initial Impression / Assessment and Plan / ED Course  I have reviewed the triage vital signs and the nursing notes.  Pertinent labs & imaging results that were available during my care of the patient were reviewed by me and considered in my medical decision making (see chart for details).     26 y.o. female here  with ongoing HA x1 month, intermittent but daily. States she stares at computer screens all day and thought that could be it, and her wisdom teeth are coming in which she thinks could possibly be it as well; also reports dry cough and nasal congestion x1 month. On exam, mild nasal congestion, mild L maxillary TTP, no focal neuro deficits, clear lung exam. Doubt need for labs or imaging, likely sinusitis contributing to HA. Will give migraine cocktail including decadron, as long as she feels improved, will send home with abx to cover for bacterial etiology of sinusitis. Will reassess shortly  5:52 PM Pt feeling improved and tolerating PO well. Will send home with doxycycline for sinusitis (pt allergic to augmentin; denies possibility of pregnancy), advised other OTC remedies for symptomatic relief. Rx for reglan given as well, to help with nausea and headaches. Discussed tylenol/motrin for headaches, staying well hydrated, f/up with [facility] in 1wk for recheck and to establish medical care.  I explained the diagnosis and have given explicit precautions to return to the ER including for any other new or worsening symptoms. The patient understands and accepts the medical plan as it's been dictated and I have answered their questions. Discharge instructions concerning home care and prescriptions have been given. The patient is STABLE and is discharged to home in good condition.    Final Clinical Impressions(s) / ED Diagnoses   Final diagnoses:  Sinus headache  Acute non-recurrent maxillary sinusitis  Nausea and vomiting in adult patient  Cough    New Prescriptions New Prescriptions   DOXYCYCLINE (VIBRAMYCIN) 100 MG CAPSULE    Take 1 capsule (100 mg total) by mouth 2 (two) times daily.   METOCLOPRAMIDE (REGLAN) 10 MG TABLET    Take 1 tablet (10 mg total) by mouth every 6 (six) hours as needed for nausea (nausea/headache).     [address] [place], [place] 09/19/16 1753    Fredia Sorrow, MD 09/21/16  [phone]

## 2016-09-19 NOTE — Discharge Instructions (Signed)
Continue to stay well-hydrated. Continue to alternate between Tylenol and Ibuprofen for pain or fever. Use Mucinex for cough suppression/expectoration of mucus. Use netipot and flonase to help with nasal congestion. May consider over-the-counter Benadryl or other antihistamine to decrease secretions and for help with your symptoms. Use reglan as directed as needed for nausea and headaches. Take antibiotic as directed until completed. Follow up with the [facility] in 5-7 days for recheck of ongoing symptoms and to establish medical care. Return to emergency department for emergent changing or worsening of symptoms.

## 2016-10-31 ENCOUNTER — Emergency Department (HOSPITAL_COMMUNITY)

## 2016-10-31 ENCOUNTER — Encounter (HOSPITAL_COMMUNITY): Payer: Self-pay

## 2016-10-31 ENCOUNTER — Emergency Department (HOSPITAL_COMMUNITY)
Admission: EM | Admit: 2016-10-31 | Discharge: 2016-10-31 | Disposition: A | Discharge status: Home / Self Care | Attending: Emergency Medicine | Admitting: Emergency Medicine

## 2016-10-31 DIAGNOSIS — J189 Pneumonia, unspecified organism: Secondary | ICD-10-CM

## 2016-10-31 DIAGNOSIS — J181 Lobar pneumonia, unspecified organism: Secondary | ICD-10-CM | POA: Insufficient documentation

## 2016-10-31 MED ORDER — LEVOFLOXACIN 750 MG PO TABS
750.0000 mg | ORAL_TABLET | Freq: Once | ORAL | Status: AC
  Administered 2016-10-31: 750 mg via ORAL
  Filled 2016-10-31: qty 1

## 2016-10-31 MED ORDER — LEVOFLOXACIN 750 MG PO TABS: 750.0000 mg | ORAL_TABLET | Freq: Every day | ORAL | 0 refills | Status: AC

## 2016-10-31 NOTE — ED Notes (Addendum)
Patient transported to XR. 

## 2016-10-31 NOTE — ED Triage Notes (Signed)
Pt has large hx of pneumonia.  Pt states cough with nasal thickness x 2 days.  Not feeling well.  No fever.  No pain

## 2016-10-31 NOTE — ED Provider Notes (Signed)
[facility] DEPT Provider Note   CSN: [id number] Arrival date & time: 10/31/16  1750     History   Chief Complaint Chief Complaint  Patient presents with  . Cough    HPI Kayson Bullis is a 26 y.o. female.  HPI    Mikell Camp is a 26 y.o. 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  Patient notes that this feels similar to the beginning signs of pneumonia which she has had in the past.  Patient notes last episode of pneumonia was in December 2017 approximately 8 months ago.  She notes over the last 2 days she has been more fatigued, worsening cough.  She denies any shortness of breath, denies any fever, nausea or vomiting.  Patient reports she is not pregnant, also reports she is a non-smoker.  Patient denies any medications prior to arrival.  Patient reports that she has albuterol at home and uses it as needed, but has not needed it recently.   Past Medical History:  Diagnosis Date  . Colitis, acute   . Cough   . GERD (gastroesophageal reflux disease)   . PNA (pneumonia)     Patient Active Problem List   Diagnosis Date Noted  . Hypokalemia 02/23/2012  . Iron deficiency anemia 02/23/2012  . Anemia 02/22/2012  . Sepsis ([place]) 02/21/2012  . Recurrent pneumonia 02/21/2012  . Tachycardia 02/21/2012  . Leukocytosis 02/21/2012  . Lactic acidosis 02/21/2012  . Hyponatremia 02/21/2012  . Acute respiratory failure ([place]) 02/21/2012  . GERD (gastroesophageal reflux disease)   . Colitis, acute   . Bronchiectasis ([place]) 07/31/2011    Past Surgical History:  Procedure Laterality Date  . illeostomy      OB History    No data available       Home Medications    Prior to Admission medications   Medication Sig Start Date End Date Taking? Authorizing Provider  albuterol (PROVENTIL HFA;VENTOLIN HFA) 108 (90 Base) MCG/ACT inhaler Inhale 2 puffs into the lungs every 6 (six) hours  as needed for wheezing. 11/09/13   [provider]  levofloxacin (LEVAQUIN) 750 MG tablet Take 1 tablet (750 mg total) by mouth daily. 10/31/16   Omari Koslosky, Dellis Filbert, PA-C  metoCLOPramide (REGLAN) 10 MG tablet Take 1 tablet (10 mg total) by mouth every 6 (six) hours as needed for nausea (nausea/headache). 09/19/16   Street, [place], PA-C    Family History Family History  Problem Relation Age of Onset  . Other Mother     Social History Social History  Substance Use Topics  . Smoking status: Never Smoker  . Smokeless tobacco: Never Used  . Alcohol use No     Allergies   Augmentin [amoxicillin-pot clavulanate] and Tape   Review of Systems Review of Systems  All other systems reviewed and are negative.   Physical Exam Updated Vital Signs BP 113/81   Pulse 94   Temp 98.3 F (36.8 C) (Oral)   Resp 18   LMP 10/25/2016   SpO2 98%   Physical Exam  Constitutional: She is oriented to person, place, and time. She appears well-developed and well-nourished.  HENT:  Head: Normocephalic and atraumatic.  Eyes: Pupils are equal, round, and reactive to light. Conjunctivae are normal. Right eye exhibits no discharge. Left eye exhibits no discharge. No scleral icterus.  Neck: Normal range of motion. No JVD present. No tracheal deviation present.  Pulmonary/Chest: Effort normal. No stridor.  Faint  crackles left lower lobe-no wheeze or respiratory distress  Neurological: She is alert and oriented to person, place, and time. Coordination normal.  Psychiatric: She has a normal mood and affect. Her behavior is normal. Judgment and thought content normal.  Nursing note and vitals reviewed.  ED Treatments / Results  Labs (all labs ordered are listed, but only abnormal results are displayed) Labs Reviewed - No data to display  EKG  EKG Interpretation None       Radiology Dg Chest 2 View  Result Date: 10/31/2016 CLINICAL DATA:  Cough, dyspnea, history bronchiectasis. EXAM: CHEST   2 VIEW COMPARISON:  03/31/2016 FINDINGS: The heart size and mediastinal contours are within normal limits. Aortic atherosclerosis without aneurysm. The lungs are hyperinflated. Patchy and linear opacities at the left lung base suspicious for atelectasis and/or stigmata bronchiectasis. Pneumonia is not entirely excluded. The visualized skeletal structures are unremarkable. IMPRESSION: Hyperinflated lungs with streaky and faint patchy opacities at the left lung base which may reflect stigmata of left basilar bronchiectasis, atelectasis or minimal pneumonia. Electronically Signed   By: Ashley Royalty M.D.   On: 10/31/2016 18:27    Procedures Procedures (including critical care time)  Medications Ordered in ED Medications  levofloxacin (LEVAQUIN) tablet 750 mg (not administered)     Initial Impression / Assessment and Plan / ED Course  I have reviewed the triage vital signs and the nursing notes.  Pertinent labs & imaging results that were available during my care of the patient were reviewed by me and considered in my medical decision making (see chart for details).      Final Clinical Impressions(s) / ED Diagnoses   Final diagnoses:  Community acquired pneumonia of left lower lobe of lung ([place])    Labs:   Imaging:  Consults:  Therapeutics: Levaquin   Discharge Meds: Levaquin   Assessment/Plan: 26 year old female presents today with what appears to be pneumonia.  She is well-appearing afebrile nontoxic.  She has very fine crackles with no wheeze or signs of respiratory distress.  Her oxygen saturation is 99.  Patient will be started on antibiotics, she will follow-up as an outpatient, she is given strict return precautions.  She verbalized understanding and agreement to today's plan had no further questions or concerns the time discharge.     New Prescriptions New Prescriptions   LEVOFLOXACIN (LEVAQUIN) 750 MG TABLET    Take 1 tablet (750 mg total) by mouth daily.     Okey Regal, PA-C 10/31/16 1944    Gareth Morgan, MD 10/31/16 [phone]

## 2016-10-31 NOTE — Discharge Instructions (Signed)
Please read attached information. If you experience any new or worsening signs or symptoms please return to the emergency room for evaluation. Please follow-up with your primary care provider or specialist as discussed. Please use medication prescribed only as directed and discontinue taking if you have any concerning signs or symptoms.   °

## 2016-11-24 ENCOUNTER — Telehealth: Admitting: Nurse Practitioner

## 2016-11-24 DIAGNOSIS — J0101 Acute recurrent maxillary sinusitis: Secondary | ICD-10-CM

## 2016-11-24 MED ORDER — DOXYCYCLINE HYCLATE 100 MG PO TABS: 100.0000 mg | ORAL_TABLET | Freq: Two times a day (BID) | ORAL | 0 refills | Status: DC

## 2016-11-24 NOTE — Progress Notes (Signed)

## 2017-02-12 ENCOUNTER — Telehealth: Admitting: Family

## 2017-02-12 DIAGNOSIS — B9689 Other specified bacterial agents as the cause of diseases classified elsewhere: Secondary | ICD-10-CM

## 2017-02-12 DIAGNOSIS — J329 Chronic sinusitis, unspecified: Secondary | ICD-10-CM

## 2017-02-12 MED ORDER — DOXYCYCLINE HYCLATE 100 MG PO TABS: 100.0000 mg | ORAL_TABLET | Freq: Two times a day (BID) | ORAL | 0 refills | Status: AC

## 2017-02-12 NOTE — Progress Notes (Signed)
Thank you for the details you included in the comment boxes. Those details are very helpful in determining the best course of treatment for you and help [place] to provide the best care. I read everything you said and I will say that we can do the 10 day doxy script but if it does not help you, you will have to find a way to be seen face-to-face regardless of insurance status. It will be far more expensive if you have to go into the hospital or an ED visit. That aside, we do have a face-to-face insta-care which is much cheaper and may be an option for you if this is ineffective. We do not do long courses of antibiotics without examining you face-to-face and running tests. This would be high risk for you and we are very careful not to take risks with our patients through this program as it is designed for very simple medical care. I hope this will take care of your symptoms.   We are sorry that you are not feeling well.  Here is how we plan to help!  Based on what you have shared with me it looks like you have sinusitis.  Sinusitis is inflammation and infection in the sinus cavities of the head.  Based on your presentation I believe you most likely have Acute Bacterial Sinusitis.  This is an infection caused by bacteria and is treated with antibiotics. I have prescribed Doxycycline 100mg  by mouth twice a day for 10 days. You may use an oral decongestant such as Mucinex D or if you have glaucoma or high blood pressure use plain Mucinex. Saline nasal spray help and can safely be used as often as needed for congestion.  If you develop worsening sinus pain, fever or notice severe headache and vision changes, or if symptoms are not better after completion of antibiotic, please schedule an appointment with a health care provider.    Sinus infections are not as easily transmitted as other respiratory infection, however we still recommend that you avoid close contact with loved ones, especially the very young and elderly.   Remember to wash your hands thoroughly throughout the day as this is the number one way to prevent the spread of infection!  Home Care:  Only take medications as instructed by your medical team.  Complete the entire course of an antibiotic.  Do not take these medications with alcohol.  A steam or ultrasonic humidifier can help congestion.  You can place a towel over your head and breathe in the steam from hot water coming from a faucet.  Avoid close contacts especially the very young and the elderly.  Cover your mouth when you cough or sneeze.  Always remember to wash your hands.  Get Help Right Away If:  You develop worsening fever or sinus pain.  You develop a severe head ache or visual changes.  Your symptoms persist after you have completed your treatment plan.  Make sure you  Understand these instructions.  Will watch your condition.  Will get help right away if you are not doing well or get worse.  Your e-visit answers were reviewed by a board certified advanced clinical practitioner to complete your personal care plan.  Depending on the condition, your plan could have included both over the counter or prescription medications.  If there is a problem please reply  once you have received a response from your provider.  Your safety is important to [place].  If you have drug allergies check  your prescription carefully.    You can use MyChart to ask questions about today's visit, request a non-urgent call back, or ask for a work or school excuse for 24 hours related to this e-Visit. If it has been greater than 24 hours you will need to follow up with your provider, or enter a new e-Visit to address those concerns.  You will get an e-mail in the next two days asking about your experience.  I hope that your e-visit has been valuable and will speed your recovery. Thank you for using e-visits.

## 2018-04-01 ENCOUNTER — Telehealth: Admitting: Family

## 2018-04-01 DIAGNOSIS — J329 Chronic sinusitis, unspecified: Secondary | ICD-10-CM

## 2018-04-01 DIAGNOSIS — R059 Cough, unspecified: Secondary | ICD-10-CM

## 2018-04-01 DIAGNOSIS — B9789 Other viral agents as the cause of diseases classified elsewhere: Secondary | ICD-10-CM

## 2018-04-01 DIAGNOSIS — R05 Cough: Secondary | ICD-10-CM

## 2018-04-01 MED ORDER — FLUTICASONE PROPIONATE 50 MCG/ACT NA SUSP
1.0000 | Freq: Two times a day (BID) | NASAL | 2 refills | Status: AC
Start: 2018-04-01 — End: ?

## 2018-04-01 MED ORDER — BENZONATATE 100 MG PO CAPS: 100.0000 mg | ORAL_CAPSULE | Freq: Three times a day (TID) | ORAL | 0 refills | Status: AC | PRN

## 2018-04-01 NOTE — Progress Notes (Signed)
Thank you for the details you included in the comment boxes. Those details are very helpful in determining the best course of treatment for you and help [place] to provide the best care. Providers prescribe antibiotics to treat infections caused by bacteria. Antibiotics are very powerful in treating bacterial infections when they are used properly. To maintain their effectiveness, they should be used only when necessary. Overuse of antibiotics has resulted in the development of superbugs that are resistant to treatment!    After careful review of your answers, I would not recommend an antibiotic for your condition.  Antibiotics are not effective against viruses and therefore should not be used to treat them. Common examples of infections caused by viruses include colds and flu. Antibiotics may be considered around 7-10 days if symptoms persist.   We are sorry that you are not feeling well.  Here is how we plan to help!  Based on what you have shared with me it looks like you have sinusitis.  Sinusitis is inflammation and infection in the sinus cavities of the head.  Based on your presentation I believe you most likely have Acute Viral Sinusitis.This is an infection most likely caused by a virus. There is not specific treatment for viral sinusitis other than to help you with the symptoms until the infection runs its course.  You may use an oral decongestant such as Mucinex D or if you have glaucoma or high blood pressure use plain Mucinex. Saline nasal spray help and can safely be used as often as needed for congestion, I have prescribed: Fluticasone nasal spray two sprays in each nostril once a day   I have also sent Tessalon Perles 100mg , take 1-2 every 8 hours as needed for cough.   Some authorities believe that zinc sprays or the use of Echinacea may shorten the course of your symptoms.  Sinus infections are not as easily transmitted as other respiratory infection, however we still recommend that you avoid  close contact with loved ones, especially the very young and elderly.  Remember to wash your hands thoroughly throughout the day as this is the number one way to prevent the spread of infection!  Home Care:  Only take medications as instructed by your medical team.  Do not take these medications with alcohol.  A steam or ultrasonic humidifier can help congestion.  You can place a towel over your head and breathe in the steam from hot water coming from a faucet.  Avoid close contacts especially the very young and the elderly.  Cover your mouth when you cough or sneeze.  Always remember to wash your hands.  Get Help Right Away If:  You develop worsening fever or sinus pain.  You develop a severe head ache or visual changes.  Your symptoms persist after you have completed your treatment plan.  Make sure you  Understand these instructions.  Will watch your condition.  Will get help right away if you are not doing well or get worse.  Your e-visit answers were reviewed by a board certified advanced clinical practitioner to complete your personal care plan.  Depending on the condition, your plan could have included both over the counter or prescription medications.  If there is a problem please reply  once you have received a response from your provider.  Your safety is important to [place].  If you have drug allergies check your prescription carefully.    You can use MyChart to ask questions about today's visit, request a non-urgent  call back, or ask for a work or school excuse for 24 hours related to this e-Visit. If it has been greater than 24 hours you will need to follow up with your provider, or enter a new e-Visit to address those concerns.  You will get an e-mail in the next two days asking about your experience.  I hope that your e-visit has been valuable and will speed your recovery. Thank you for using e-visits.

## 2020-08-12 ENCOUNTER — Other Ambulatory Visit: Payer: Self-pay | Admitting: Gastroenterology

## 2020-08-12 DIAGNOSIS — K659 Peritonitis, unspecified: Secondary | ICD-10-CM

## 2020-08-29 ENCOUNTER — Other Ambulatory Visit: Payer: Self-pay

## 2020-08-29 ENCOUNTER — Ambulatory Visit
Admission: RE | Admit: 2020-08-29 | Discharge: 2020-08-29 | Disposition: A | Discharge status: Home / Self Care | Payer: Self-pay | Source: Ambulatory Visit | Attending: Gastroenterology | Admitting: Gastroenterology

## 2020-08-29 DIAGNOSIS — K659 Peritonitis, unspecified: Secondary | ICD-10-CM

## 2020-08-29 MED ORDER — GADOBENATE DIMEGLUMINE 529 MG/ML IV SOLN
15.0000 mL | Freq: Once | INTRAVENOUS | Status: AC | PRN
  Administered 2020-08-29: 15 mL via INTRAVENOUS

## 2022-11-02 IMAGING — MR MR PELVIS WO/W CM
4 of 8 series · 18 of 48 positions shown · IV contrast (multihance)
Comparison: None.

CLINICAL DATA: Perianal fistula.

EXAM:
MRI PELVIS WITHOUT AND WITH CONTRAST
TECHNIQUE: Multiplanar multisequence MR imaging of the pelvis was performed
both before and after administration of intravenous contrast.
CONTRAST:  15mL MULTIHANCE GADOBENATE DIMEGLUMINE 529 MG/ML IV SOLN

[Series 4: t2_tse_sag · sagittal · 2.5mm · 0.45mm/px · 6 of 47 slices shown]
[im 1/47]
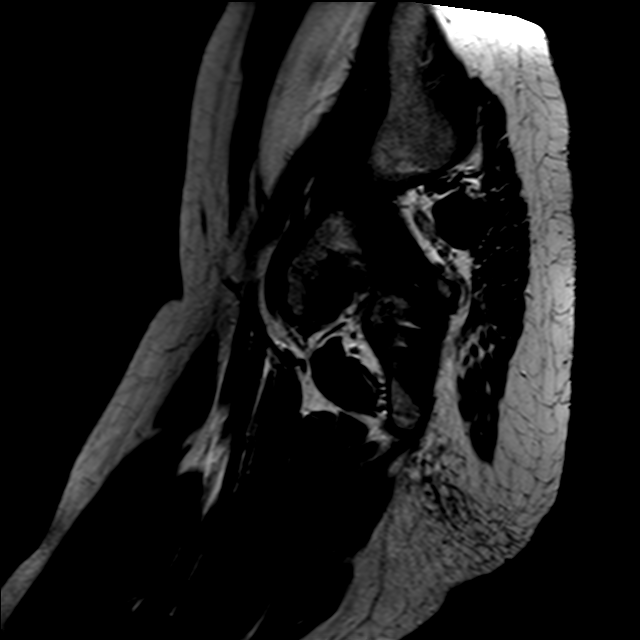
[im 10/47]
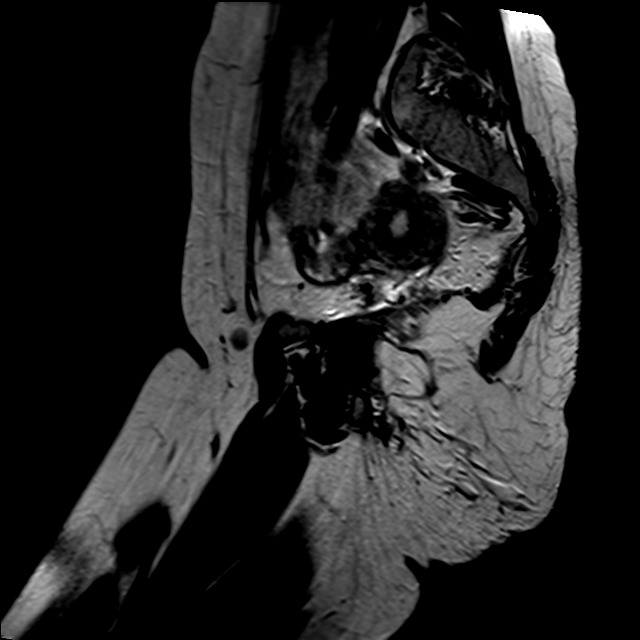
[im 19/47]
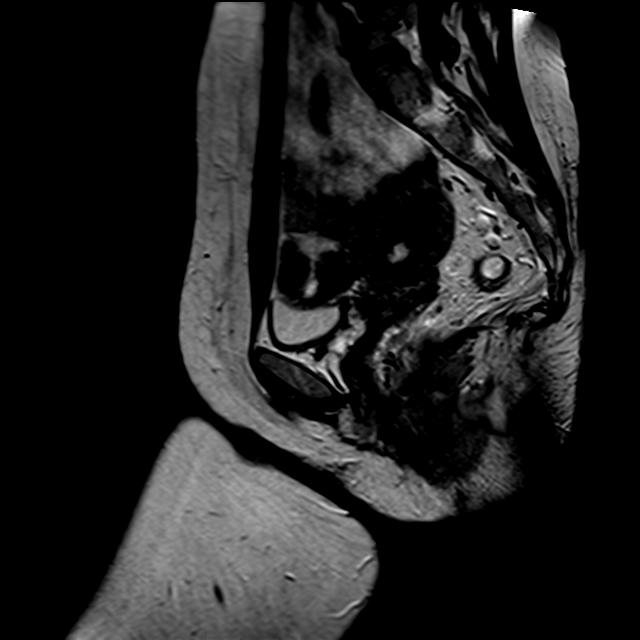
[im 28/47]
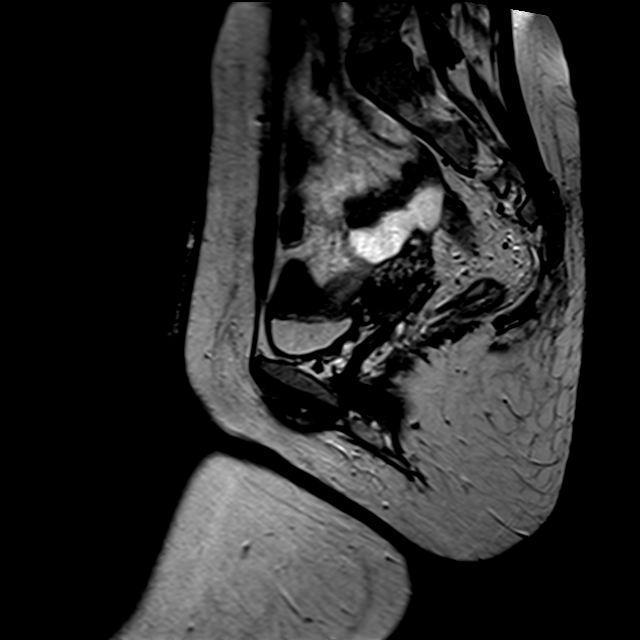
[im 37/47]
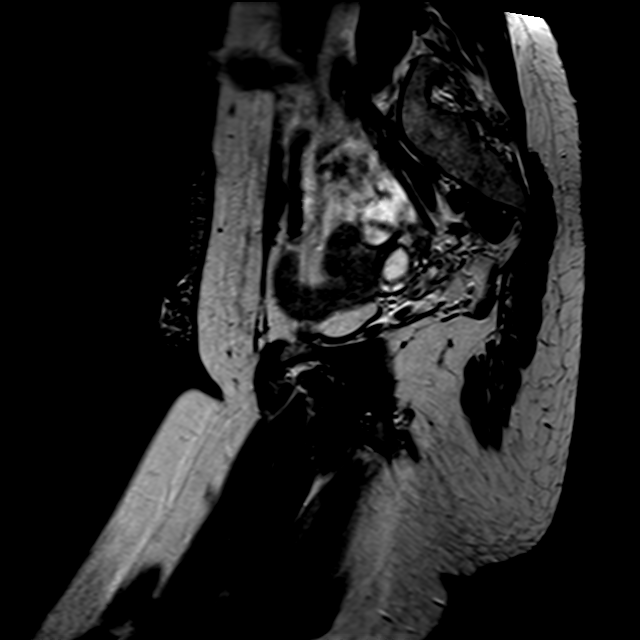
[im 47/47]
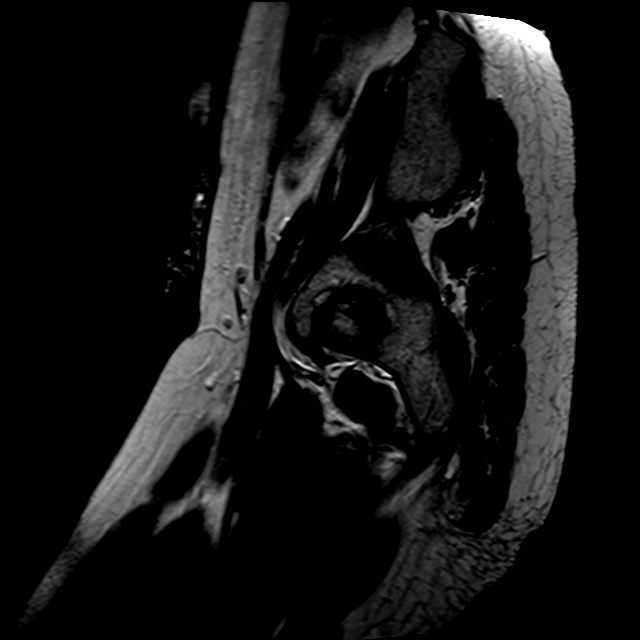

[Series 5: t2_tse axial · axial · 4.0mm · 0.81mm/px · z∈[-248,-97]mm · 6 of 37 slices shown]
[im 1/37]
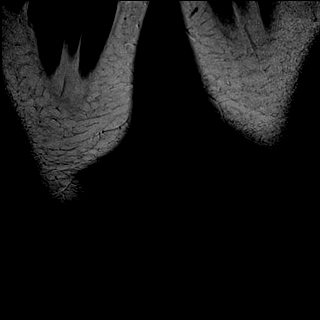
[im 8/37]
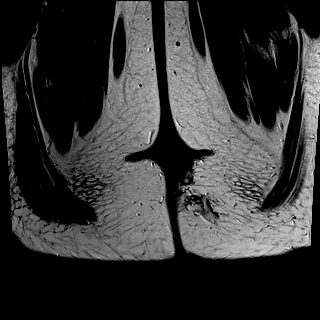
[im 15/37]
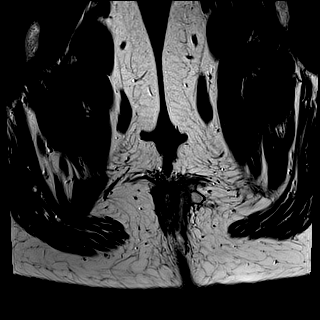
[im 22/37]
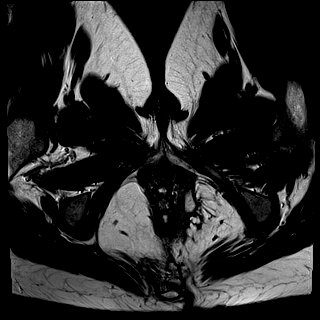
[im 29/37]
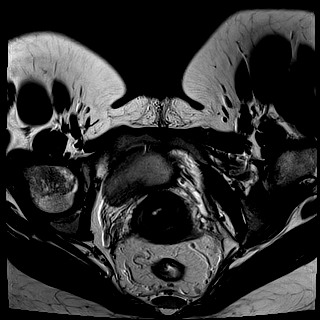
[im 37/37]
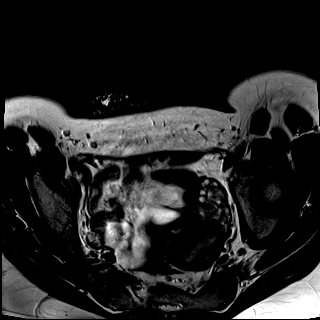

[Series 6: T1 · axial · 4.0mm · 0.41mm/px · z∈[-212,-91]mm · 3 of 37 slices shown]
[im 8/37]
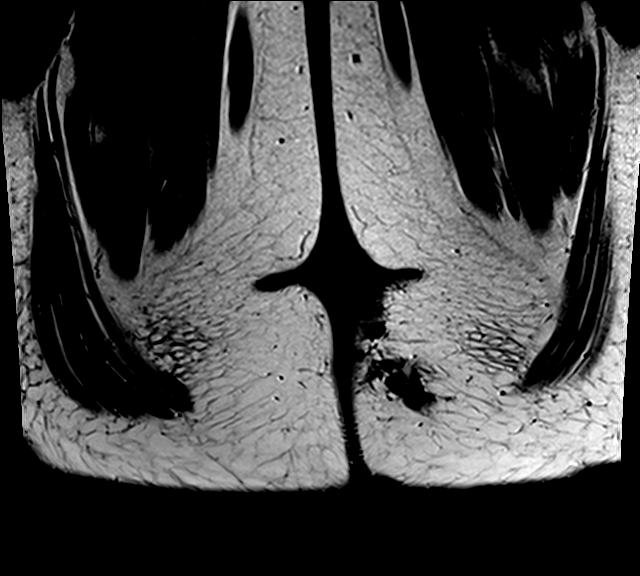
[im 22/37]
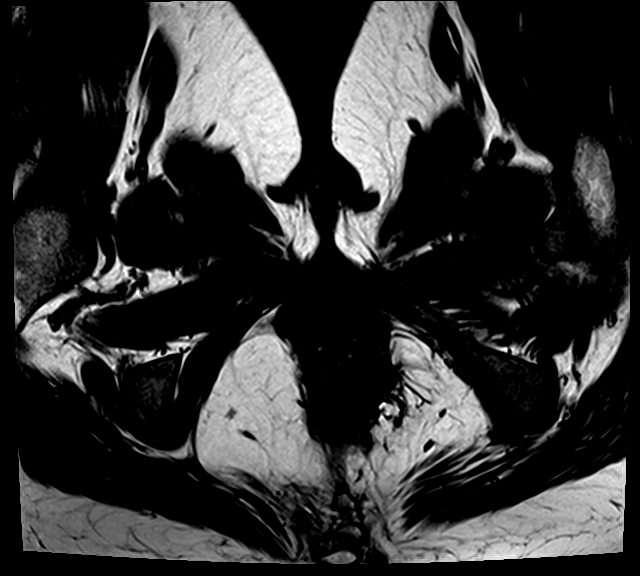
[im 37/37]
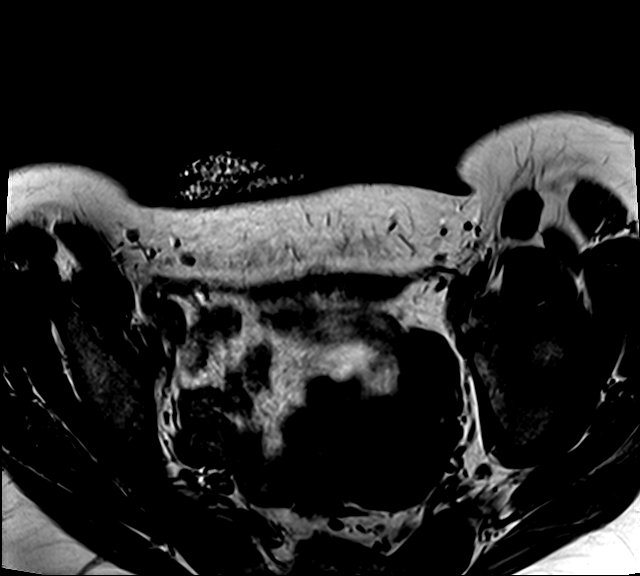

[Series 7: T1 fat-sat · axial · 4.0mm · 0.56mm/px · z∈[-221,-99]mm · 3 of 37 slices shown]
[im 8/37]
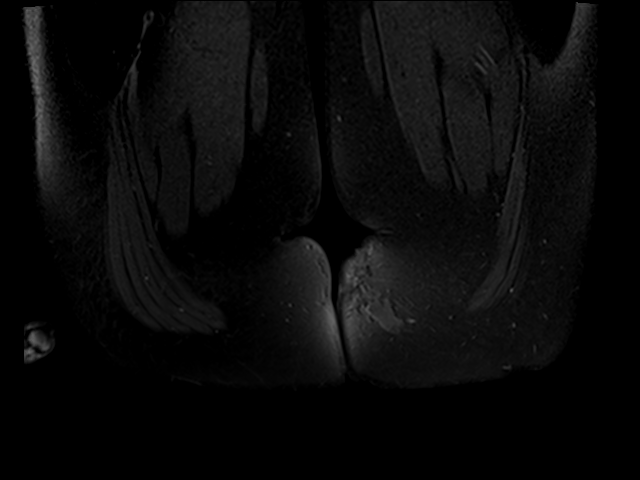
[im 22/37]
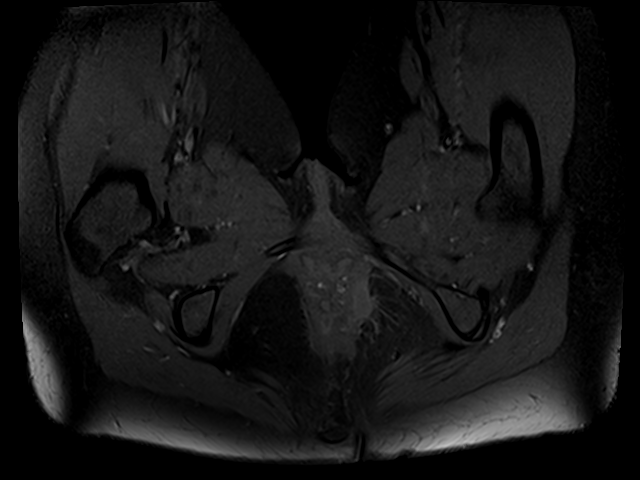
[im 37/37]
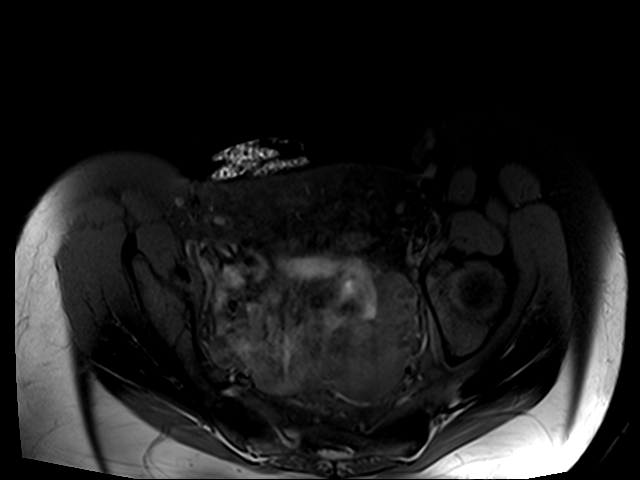

[18 of 48 positions shown; findings below may reference images not displayed]

FINDINGS: Lower Urinary Tract: Unremarkable urinary bladder.

Bowel: A complex left-sided perianal fistula is seen which in
originates from the posterior wall of the anal canal at the 5 to 6
o'clock position. This shows trans sphincteric extension with
multiple separate tracts which extend superiorly and inferiorly in
the left perianal fossa.

An associated abscess is seen just inferior to the left levator ani
muscle, which measures 2.0 x 1.6 cm on image [DATE]. No evidence of
supralevator extension. Another abscess collection is seen in the
inferior perianal soft tissues below the anal canal measuring
approximately 1.9 x 1.5 cm on image [DATE]. Multiple fistulous tracts
are seen extending to the skin surface within the gluteal crease or
medial left buttock.

Vascular/Lymphatic: No pathologically enlarged lymph nodes or other
significant abnormality seen in lower pelvis.

Reproductive: Normal appearance of uterus and left ovary. A
benign-appearing hemorrhagic cyst is seen in the right ovary
measuring 1.6 cm. No imaging follow-up recommended.

Other: None.

Musculoskeletal: No significant abnormality identified.
IMPRESSION: Complex left transsphincteric perianal fistula with associated
abscess collections, as described above. ([REDACTED] Classification grade 4)
